# Patient Record
Sex: Male | Born: 1945 | Race: White | Hispanic: No | Marital: Married | Smoking: Former smoker
Health system: Southern US, Community
[De-identification: ages and names within clinical notes are randomized; demographics above are authoritative.]

## PROBLEM LIST (undated history)

## (undated) DIAGNOSIS — E119 Type 2 diabetes mellitus without complications: Secondary | ICD-10-CM

## (undated) DIAGNOSIS — I10 Essential (primary) hypertension: Secondary | ICD-10-CM

## (undated) DIAGNOSIS — F0280 Dementia in other diseases classified elsewhere without behavioral disturbance: Secondary | ICD-10-CM

---

## 2012-10-26 ENCOUNTER — Inpatient Hospital Stay: Payer: Self-pay | Admitting: Orthopedic Surgery

## 2012-10-26 ENCOUNTER — Ambulatory Visit: Payer: Self-pay | Admitting: Internal Medicine

## 2012-10-26 LAB — BASIC METABOLIC PANEL
Anion Gap: 8 (ref 7–16)
Calcium, Total: 8.9 mg/dL (ref 8.5–10.1)
Chloride: 106 mmol/L (ref 98–107)
Co2: 24 mmol/L (ref 21–32)
Creatinine: 1.14 mg/dL (ref 0.60–1.30)
EGFR (African American): >60
EGFR (Non-African Amer.): >60
Potassium: 3.7 mmol/L (ref 3.5–5.1)
Sodium: 138 mmol/L (ref 136–145)

## 2012-10-26 LAB — CBC
HCT: 40.7 % (ref 40.0–52.0)
HGB: 14.1 g/dL (ref 13.0–18.0)
MCHC: 34.6 g/dL (ref 32.0–36.0)
Platelet: 252 10*3/uL (ref 150–440)
RBC: 4.47 10*6/uL (ref 4.40–5.90)
WBC: 17.2 10*3/uL — ABNORMAL HIGH (ref 3.8–10.6)

## 2012-10-27 LAB — CBC WITH DIFFERENTIAL/PLATELET
Basophil %: 0.7 %
Eosinophil %: 10.5 %
HGB: 13.1 g/dL (ref 13.0–18.0)
Lymphocyte #: 2 10*3/uL (ref 1.0–3.6)
MCH: 31.7 pg (ref 26.0–34.0)
MCHC: 34.5 g/dL (ref 32.0–36.0)
MCV: 92 fL (ref 80–100)
Monocyte #: 1 x10 3/mm (ref 0.2–1.0)
Monocyte %: 9.1 %
Neutrophil #: 6.8 10*3/uL — ABNORMAL HIGH (ref 1.4–6.5)
Neutrophil %: 61.3 %
Platelet: 230 10*3/uL (ref 150–440)
RBC: 4.13 10*6/uL — ABNORMAL LOW (ref 4.40–5.90)
WBC: 11 10*3/uL — ABNORMAL HIGH (ref 3.8–10.6)

## 2012-10-27 LAB — BASIC METABOLIC PANEL
BUN: 16 mg/dL (ref 7–18)
Calcium, Total: 8.4 mg/dL — ABNORMAL LOW (ref 8.5–10.1)
Co2: 26 mmol/L (ref 21–32)
Creatinine: 1.22 mg/dL (ref 0.60–1.30)
EGFR (African American): >60
EGFR (Non-African Amer.): >60
Osmolality: 280 (ref 275–301)
Sodium: 138 mmol/L (ref 136–145)

## 2012-10-27 LAB — URINALYSIS, COMPLETE
Bacteria: NONE SEEN
Ketone: NEGATIVE
Leukocyte Esterase: NEGATIVE

## 2012-10-28 LAB — CBC WITH DIFFERENTIAL/PLATELET
Basophil #: 0.1 10*3/uL (ref 0.0–0.1)
Basophil %: 0.5 %
Eosinophil %: 4.1 %
HCT: 36 % — ABNORMAL LOW (ref 40.0–52.0)
HGB: 12.1 g/dL — ABNORMAL LOW (ref 13.0–18.0)
Lymphocyte #: 2.5 10*3/uL (ref 1.0–3.6)
Lymphocyte %: 18.4 %
MCH: 30.9 pg (ref 26.0–34.0)
MCHC: 33.6 g/dL (ref 32.0–36.0)
Monocyte %: 8.3 %
Neutrophil #: 9.2 10*3/uL — ABNORMAL HIGH (ref 1.4–6.5)
Platelet: 200 10*3/uL (ref 150–440)
RBC: 3.92 10*6/uL — ABNORMAL LOW (ref 4.40–5.90)

## 2012-10-28 LAB — BASIC METABOLIC PANEL
Anion Gap: 3 — ABNORMAL LOW (ref 7–16)
BUN: 18 mg/dL (ref 7–18)
Chloride: 102 mmol/L (ref 98–107)
Co2: 30 mmol/L (ref 21–32)
Glucose: 178 mg/dL — ABNORMAL HIGH (ref 65–99)
Osmolality: 276 (ref 275–301)

## 2012-10-29 LAB — WBC: WBC: 10.8 10*3/uL — ABNORMAL HIGH (ref 3.8–10.6)

## 2012-10-29 LAB — BASIC METABOLIC PANEL
Anion Gap: 3 — ABNORMAL LOW (ref 7–16)
Calcium, Total: 8.4 mg/dL — ABNORMAL LOW (ref 8.5–10.1)
Chloride: 108 mmol/L — ABNORMAL HIGH (ref 98–107)
Creatinine: 1.12 mg/dL (ref 0.60–1.30)
EGFR (African American): >60
Glucose: 186 mg/dL — ABNORMAL HIGH (ref 65–99)
Potassium: 3.8 mmol/L (ref 3.5–5.1)

## 2012-12-18 ENCOUNTER — Encounter: Payer: Self-pay | Admitting: Orthopedic Surgery

## 2012-12-31 ENCOUNTER — Encounter: Payer: Self-pay | Admitting: Orthopedic Surgery

## 2013-01-10 ENCOUNTER — Other Ambulatory Visit: Payer: Self-pay | Admitting: Orthopedic Surgery

## 2013-01-17 ENCOUNTER — Encounter: Payer: Self-pay | Admitting: Nurse Practitioner

## 2013-01-17 ENCOUNTER — Encounter: Payer: Self-pay | Admitting: Cardiothoracic Surgery

## 2013-01-17 ENCOUNTER — Ambulatory Visit: Payer: Self-pay

## 2013-01-30 ENCOUNTER — Encounter: Payer: Self-pay | Admitting: Orthopedic Surgery

## 2013-02-21 ENCOUNTER — Encounter: Payer: Self-pay | Admitting: Nurse Practitioner

## 2013-02-21 ENCOUNTER — Encounter: Payer: Self-pay | Admitting: Surgery

## 2013-03-02 ENCOUNTER — Encounter: Payer: Self-pay | Admitting: Surgery

## 2013-08-01 ENCOUNTER — Ambulatory Visit: Payer: Self-pay | Admitting: Internal Medicine

## 2014-11-22 NOTE — H&P (Signed)
Subjective/Chief Complaint Left ankle injury   History of Present Illness Patient is a 69 y/o male who sustained an injury to the left ankle while cutting down tree branches on his property today.  A branch swung down and hit him in the lateral leg.  His foot was planted and he sustained a valgus stress to the ankle.   The branch landed on the patient and he had to cut the branch off his leg.  He had pain and deformity after the fall.  He initially went to [facility] urgent care and was transferred to [facility] ED.  Radiographs demostrated a fracture of the lateral malleolus with significant lateral talar subluxation.   Past Med/Surgical Hx:  Diabetes Mellitus, Type II (NIDD):   ALLERGIES:  No Known Allergies:   HOME MEDICATIONS: Medication Instructions Status  insulin isophane-insulin regular  subcutaneous  Active  sertraline  orally  Active  glimepiride 1  orally once a day Active  metformin 1  orally 2 times a day Active   Family and Social History:  Family History Non-Contributory   Social History negative tobacco   Place of Living Home   Review of Systems:  Subjective/Chief Complaint Left ankle pain and swelling.   Physical Exam:  GEN no acute distress   HEENT PERRL, hearing intact to voice, moist oral mucosa, Oropharynx clear   NECK supple  No masses  trachea midline   RESP normal resp effort  clear BS  no use of accessory muscles   CARD regular rate  no murmur   ABD denies tenderness  soft  normal BS   LYMPH negative neck   EXTR Left ankle: skin intact.  +ecchymosis, pedal pulses are palpable, foot is warm and well perfused.  Patient had swelling and valgus deformity prior to reduction.   SKIN normal to palpation, No ulcers   NEURO motor/sensory function intact   PSYCH A+O to time, place, person   Lab Results: Routine Chem:  27-Mar-14 18:06   Glucose, Serum  119  BUN 16  Creatinine (comp) 1.14  Sodium, Serum 138  Potassium, Serum 3.7  Chloride, Serum 106   CO2, Serum 24  Calcium (Total), Serum 8.9  Anion Gap 8  Osmolality (calc) 278  eGFR (African American) >60  eGFR (Non-African American) >60 (eGFR values <59m/min/1.73 m2 may be an indication of chronic kidney disease (CKD). Calculated eGFR is useful in patients with stable renal function. The eGFR calculation will not be reliable in acutely ill patients when serum creatinine is changing rapidly. It is not useful in  patients on dialysis. The eGFR calculation may not be applicable to patients at the low and high extremes of body sizes, pregnant women, and vegetarians.)  Routine Hem:  27-Mar-14 18:06   WBC (CBC)  17.2  RBC (CBC) 4.47  Hemoglobin (CBC) 14.1  Hematocrit (CBC) 40.7  Platelet Count (CBC) 252 (Result(s) reported on 26 Oct 2012 at 06:33PM.)  MCV 91  MCH 31.5  MCHC 34.6  RDW 13.2   Radiology Results: XRay:    27-Mar-14 15:57, Ankle Left Complete ([place])  Ankle Left Complete ([place])  REASON FOR EXAM:    pain/swelling  COMMENTS:       PROCEDURE: MDR - MDR ANKLE LEFT COMPLETE  - Oct 26 2012  3:57PM     RESULT: There is fracture involving the distal left fibular shaft   superior to the distal tibiofibular articulation with lateral   displacement of the talus relative to the tibia. Avulsion is seen at the  medial malleolus. Posterior malleolus appears to be intact on the views   submitted although true lateral view was not obtained.    IMPRESSION:  Fracture-dislocation of the left ankle as described.   Orthopedic surgical consultation and followup is recommended.    Dictation Site: 2    Verified By: Sundra Aland, M.D., MD    27-Mar-14 16:53, Tibia and Fibula Left Lower Leg ([place])  Tibia and Fibula Left Lower Leg ([place])  REASON FOR EXAM:    possible trimalleolar frx, mid shin pain also  COMMENTS:       PROCEDURE: MDR - MDR TIBIA AND FIBULA LT-LOW LEG  - Oct 26 2012  4:53PM     RESULT: Angulated fractures of the distal left fibula. Tibial talar  joint   is disrupted.    IMPRESSION:  Angulated fracture with comminution of the distal fibular   shaft. Disruption of the tibiotalar joints present.        Verified By: Osa Craver, M.D., MD  LabUnknown:    27-Mar-14 15:57, Ankle Left Complete ([place])  PACS Image    27-Mar-14 16:53, Tibia and Fibula Left Lower Leg ([place])  PACS Image    Assessment/Admission Diagnosis Left ankle lateral malleolus fracture with talar subluxation   Plan Patient informed of his injury.  I have recommended a closed reduction with an ankle block in the ER and open reduction and internal fixation of the lateral malleolus tomorrow.  The risks and benefits of surgical intervention were discussed in detail with the patient. The patient expressed understanding of the risks and benefits and agreed with plans for surgery. The risks include, but are not limited to: infection, bleeding, nerve and blood vessel injury (especially the superficial peroneal nerve leading to dorsal foot numbness), ankle stiffness, osteoarthritis, painful hardware, hardware failure, persistent pain or instability and the need for further surgery.  Medical risks include, but are not limited to:  DVT, and PE, MI, stroke, pneumonia, respiratory failure and death.  Patient is NPO after midnight.  A medical consult has been ordered for pre-op clearance.    An ankle block was performed by injecting the ankle joint from an anterior approach under sterile conditions with a 50:50 mix of 1% lidocaine plain and 0.25% marcaine plain.  A closed reduction was then performed with a varus force applied to the left ankle.  An AO splint was then applied.  Post-reduction films were then ordered and reviewed.  I have reviewed all the patient's labs and rediographic studies.  Surgery will be tomorrow morning pending medical clearance.   Electronic Signatures: Thornton Park (MD)  (Signed 27-Mar-14 22:13)  Authored: CHIEF COMPLAINT and HISTORY, PAST  MEDICAL/SURGIAL HISTORY, ALLERGIES, HOME MEDICATIONS, FAMILY AND SOCIAL HISTORY, REVIEW OF SYSTEMS, PHYSICAL EXAM, LABS, Radiology, ASSESSMENT AND PLAN   Last Updated: 27-Mar-14 22:13 by Thornton Park (MD)

## 2014-11-22 NOTE — Op Note (Signed)
PATIENT NAME:  Arthur [name], Arthur [name] MR#:  [id number] DATE OF BIRTH:  06-13-1946  DATE OF PROCEDURE:  10/27/2012  PREOPERATIVE DIAGNOSIS: Left lateral malleolar ankle fracture with deltoid ligament tear.  POSTOPERATIVE DIAGNOSIS:   Left lateral malleolar ankle fracture with deltoid ligament tear.  PROCEDURE: Open reduction internal fixation of left lateral malleolus fracture and repair of the deltoid ligament.   SURGEON: Juanell FairlyKevin Dara Camargo, M.D.   ANESTHESIA: General.   COMPLICATIONS: None.   ESTIMATED BLOOD LOSS: Minimal.   TOURNIQUET TIME:  108 minutes.   IMPLANTS:  Synthes 1/3 tubular plate and Mitek Superanchor for deltoid ligament repair.  INDICATIONS FOR PROCEDURE: The patient is a 69 year old male who injured his left ankle while cutting trees with a chain saw yesterday. A portion of the tree hit his left leg causing the above-noted injury. The patient had an attempted closed reduction under ankle block in the Emergency Room last night which did not adequately reduce the fracture. Given the patient's high functioning at baseline, I recommended open reduction and internal fixation of the left lateral malleolus fracture and possible repair of the deltoid ligament and syndesmosis if necessary.   I reviewed the risks and benefits of surgery with the patient prior to surgery. He understands the risks include of surgery include infection, bleeding, nerve or blood vessel injury especially injury to the superficial peroneal nerve which may lead to permanent dorsal foot numbness, malunion, nonunion, hardware failure, painful hardware, persistent ankle pain or osteoarthritis and the need for further surgery. Medical complications include, but are not limited to, deep vein thrombosis and pulmonary embolism, myocardial infarction, stroke, pneumonia, respiratory failure and death. The patient understood these risks and wished to proceed.   PROCEDURE NOTE: The patient was brought to the operating room on  10/27/2012 after being cleared by the hospitalist service for surgery. He underwent general endotracheal intubation and was laid supine on a regular surgical table. The patient was prepped and draped in a sterile fashion. A timeout was performed to verify the patient's name, date of birth, medical record number, correct site of surgery and correct procedure to be performed. It is also to ensure the patient had received antibiotics and that all appropriate instruments, implants and radiographic studies were available in the room. Once all in attendance were in agreement, the case began.   The patient's leg was exsanguinated with an Esmarch. The tourniquet was inflated to 275 mmHg for a total 108 minutes. Once the patient was exsanguinated a vertical incision was made over the medial malleolus. It was felt that the failure to close reduce the ankle was due to inter position of the deltoid ligament within the medial mortise. The subcutaneous tissues were carefully dissected using a Metzenbaum scissor and pick-up. The deltoid ligament was indeed in the joint and was removed from the mortise joints.  The deep deltoid fibers appeared to have avulsed off from the talus. The attention was then turned to the lateral side of the ankle.   A longitudinal incision based over the lateral malleolus was made. The subcutaneous tissues were carefully dissected using a Metzenbaum scissor and pick-up. Care was taken to look for and avoid injury to the superficial peroneal nerve. The fracture site was identified. The overlying soft tissue was removed from the lateral aspect of the fibula with an elevator. The fracture hematoma was curetted out and the wound was copiously irrigated. A fracture clamp was used to reduce the oblique fibular fracture. Then a single lag screw, 3.5 mm bicortical lag screw,  was then placed across the fracture site which helped to hold the fracture reduction. A one-third tubular plate x 8 holes was then  placed in a neutralization fashion. Four holes below the fracture and 3 above were placed.  These included 3 bicortical screws proximal to the fracture and then one bicortical screw just below the fracture along with a fully threaded cancellous screws. FluoroScan imaging in both the AP and lateral planes was performed throughout the case to ensure anatomic reduction of the fracture and good positioning of the hardware. Once the fibular plate was in place, the attention was turned back to the medial ankle.   A single Mitek super anchor was placed into the medial wall of the talus. The deep deltoid ligament was then repaired along with the anterior capsule using this Mitek super anchor. The superficial fibers of the deltoid were then approximated in their anatomic position. There was significant fraying of the superior fibers of the deltoid. The wound was copiously irrigated both medially and laterally. The subcutaneous tissues of both were copiously irrigated. A stress test was performed along with a cotton test to ensure no syndesmotic disruption. The syndesmosis was stable despite stress testing. There was no widening seen on cotton test as well. Both wounds were again were copiously irrigated. The subcutaneous tissues were closed with 2-0 Vicryl and the skin approximated with staples. Dry sterile dressings were applied. 0.25% Marcaine plain was injected both into both incisions for assistance with postoperative pain relief. An AO splint was applied. The patient was then awakened and brought to the PAC-U in stable condition. I was scrubbed and present for the entire case and all sharp and instrument counts were correct at the conclusion of the case. I spoke with the patient's daughter in the patient's hospital room following surgery to let her know that the case had gone without complication and that her father was stable in the recovery room.     ____________________________ Kathreen Devoid,  MD klk:ct D: 10/27/2012 15:57:17 ET T: 10/28/2012 07:51:20 ET JOB#: [id number]  cc: Kathreen Devoid, MD, <Dictator> Kathreen Devoid MD ELECTRONICALLY SIGNED 11/09/2012 10:59

## 2014-11-22 NOTE — Discharge Summary (Signed)
PATIENT NAME:  Arthur [name], Ankith L MR#:  [id number] DATE OF BIRTH:  25-Feb-1946  DATE OF ADMISSION:  10/26/2012 DATE OF DISCHARGE:  10/29/2012  ADMITTING DIAGNOSIS: Left ankle lateral malleolus fracture with talar subluxation.   HISTORY OF PRESENT ILLNESS: The patient is a 69 year old male who was cutting down tree branches on his property the day of admission. A branch swung down and hit him in the lateral aspect of the left leg. His leg sustained a valgus stress injury, and he sustained the above-noted injury as a result. He went to [facility] where x-rays were taken. He was diagnosed with a lateral malleolus fracture with lateral talar subluxation.   PAST MEDICAL/SURGICAL HISTORY:  Type 2 diabetes.   ALLERGIES: None.   MEDICATIONS: Include insulin, sertraline, glimepiride and metformin.   SOCIAL HISTORY: The patient does not smoke and lives at home.   HOSPITAL COURSE: The patient was admitted on 10/26/2012. He was cleared by the medical service for surgery. He went to the operating room on 10/27/2012 for an uncomplicated open reduction internal fixation of the lateral malleolus and repair of the deltoid ligament. Postoperatively, the patient was admitted to the orthopedic floor. On postoperative day 1, the patient was alert, had mild left ankle pain. He started physical therapy and completed 24 hours of postop antibiotics. By postoperative day #2, the patient was alert and sitting up. He was ready to go home. He had clinically improved and had done well with physical therapy. The patient was noted to have swelling in the left knee with an x-ray was showing underlying osteoarthritis. He was discharged with instructions to follow up in the orthopedic office in 2 to 3 days for re-evaluation of his ankle and left knee.   DISCHARGE INSTRUCTIONS: The patient was discharged with instructions to remain nonweightbearing on the left lower extremity. He will elevate his leg  whenever possible and use a walker for assistance with ambulation.   He will take enteric-coated aspirin for DVT prophylaxis. The patient was sent home with narcotic analgesia.   ____________________________ Kathreen DevoidKevin L. Faylynn Stamos, MD klk:cb D: 11/13/2012 16:12:33 ET T: 11/13/2012 16:28:29 ET JOB#: [id number]  cc: Kathreen DevoidKevin L. Kaylamarie Swickard, MD, <Dictator> Kathreen DevoidKEVIN L Sanyah Molnar MD ELECTRONICALLY SIGNED 11/14/2012 23:40

## 2014-11-22 NOTE — Consult Note (Signed)
PATIENT NAME:  Arthur [name], Arthur [name] MR#:  [id number] DATE OF BIRTH:  1946-01-18  DATE OF CONSULTATION:  10/27/2012  REFERRING PHYSICIAN: Kathreen DevoidKevin [name]. Krasinski, MD  CONSULTING PHYSICIAN:  Starleen Armsawood S. Gwendolyne Welford, MD  PRIMARY CARE PHYSICIAN: Lysle Rubensaniel D. Crummett, MD  REASON FOR CONSULTATION: Preoperative evaluation.   HISTORY OF PRESENT ILLNESS: This is a 69 year old male with significant past medical history of diabetes, depression and anxiety, who presents today with mechanical fall and left ankle fracture. The patient was working in his yard, where he was cutting down tree branches, where a branch swung down and hit him in the lateral leg, where his foot was planted, and he sustained a valgus stress to the ankle. The patient was admitted under orthopedic service, who requested medical consult for preop evaluation and medical management. The patient is planned to go for surgery today. The patient denies any chest pain, any shortness of breath, any nausea, vomiting, coffee-ground emesis, fever or chills. The patient had leukocytosis at 17,000, but his urinalysis was negative. Denies any cough or productive sputum. As well, the patient had normal EKG.   PAST MEDICAL HISTORY:  1. Diabetes.  2. Depression and anxiety.   PAST SURGICAL HISTORY: None.   HOME MEDICATIONS:  1. Percocet as needed.  2. Lantus 15 units at bedtime.  3. Metformin 850 twice a day. 4. Glimepiride 2 mg daily.  5. Sertraline 50 mg daily.  6. Aspirin 81 mg daily.  7. Tylenol as needed.   SOCIAL HISTORY: The patient is married. No history of alcohol or illicit drug use. Used to be a previous smoker, none currently.  FAMILY HISTORY: Significant for coronary artery disease in the family.    REVIEW OF SYSTEMS:  CONSTITUTIONAL: The patient denies fever, chills, fatigue, weakness, weight loss, weight gain.  HEENT: Denies any blurry vision, double vision, eye pain, glaucoma or inflammation. Denies any snoring, epistaxis, nasal drip or  discharge or hearing difficulties.  RESPIRATORY: Denies cough, asthma, sleep apnea or COPD.  CARDIOVASCULAR: Denies chest pain, edema or high blood pressure.  GASTROINTESTINAL: Denies nausea, vomiting, diarrhea, abdominal pain.  MUSCULOSKELETAL: Denies any muscle cramps, back pain, neck pain. Has left ankle pain with the fracture today.  NEUROLOGICAL: Denies headache, numbness, focal deficits or weakness or CVA or migraine.  ENDOCRINE: Denies polyuria, polydipsia, heat or cold intolerance. Has history of diabetes. HEMATOLOGIC: Denies any easy bruising or bleeding diathesis or history of blood clots.  SKIN: Denies easy bruising or skin lesions.  PSYCHIATRIC: Denies any insomnia, substance abuse, alcohol abuse. Has history of depression and anxiety.   PHYSICAL EXAMINATION:  VITAL SIGNS: Temperature 98.8, pulse 77, respiratory rate 18, blood pressure 131/76, saturating 98% on room air.  GENERAL: Well-nourished male, looks comfortable in bed, in no apparent distress.  HEENT: Head atraumatic, normocephalic. Pupils equal and reactive to light. Pink conjunctivae. Anicteric sclerae. Moist oral mucosa.  NECK: Supple. No thyromegaly. No JVD.  CHEST: Good air entry bilaterally. No wheezing, rales or rhonchi.  CARDIOVASCULAR: S1, S2 heard. No rubs, murmurs or gallops.  ABDOMEN: Soft, nontender, nondistended. Bowel sounds present.  EXTREMITIES: Right lower extremity dorsalis pedis pulses felt. No clubbing. No cyanosis. No edema. The left lower extremity is currently wrapped.  NEUROLOGICAL: Cranial nerves grossly intact. Motor 5 out of 5. No focal deficits.  SKIN: Normal skin turgor. Warm and dry. No rash.  PSYCHIATRIC: Pleasant, appropriate affect. Awake and alert x3. Intact judgment and insight.   PERTINENT LABORATORIES: Fingerstick is 194, glucose 119, BUN 16, creatinine 1.14, sodium 138,  potassium 3.7, chloride 106, CO2 24. White blood cells 17.2, hemoglobin 14.1, hematocrit 40.7, platelets 252. INR 1.  Urinalysis negative for leukocyte esterase and nitrites and less than 1 white blood cell.   ELECTROCARDIOGRAM: Showing normal sinus rhythm without significant ST or T wave changes.   ASSESSMENT AND PLAN:  1. This is a 69 year old male with significant past medical history of diabetes, who presents with mechanical fall and left ankle fracture. Plan for open ankle reduction repair. No further workup is needed prior to proceeding with surgery. May proceed with surgery. The patient is low risk for surgery.  2. Diabetes mellitus. Will hold oral hypoglycemic agents. Will keep the patient on insulin sliding scale and will lower his Lantus dose to 6 units daily, and he may get his Lantus prior to surgery today to prevent hyperglycemia later during the day.  3. Depression. Will continue on sertraline.  4. Leukocytosis. This is most likely related to stress from the fracture. Had negative urinalysis. The patient is afebrile.  5. Deep vein thrombosis prophylaxis as per primary orthopedic team.   TOTAL TIME SPENT ON MEDICAL CONSULT: 45 minutes.     ____________________________ Starleen Arms, MD dse:OSi D: 10/27/2012 06:48:48 ET T: 10/27/2012 07:44:38 ET JOB#: [id number]  cc: Starleen Arms, MD, <Dictator> [place] Teena Irani MD ELECTRONICALLY SIGNED 10/30/2012 5:33

## 2018-01-05 ENCOUNTER — Emergency Department: Payer: Medicare Other

## 2018-01-05 ENCOUNTER — Encounter: Payer: Self-pay | Admitting: Emergency Medicine

## 2018-01-05 ENCOUNTER — Other Ambulatory Visit: Payer: Self-pay

## 2018-01-05 ENCOUNTER — Emergency Department
Admission: EM | Admit: 2018-01-05 | Discharge: 2018-01-05 | Disposition: A | Discharge status: Home / Self Care | Payer: Medicare Other | Attending: Emergency Medicine | Admitting: Emergency Medicine

## 2018-01-05 DIAGNOSIS — I10 Essential (primary) hypertension: Secondary | ICD-10-CM | POA: Diagnosis not present

## 2018-01-05 DIAGNOSIS — R079 Chest pain, unspecified: Secondary | ICD-10-CM | POA: Diagnosis not present

## 2018-01-05 DIAGNOSIS — Z87891 Personal history of nicotine dependence: Secondary | ICD-10-CM | POA: Insufficient documentation

## 2018-01-05 DIAGNOSIS — E119 Type 2 diabetes mellitus without complications: Secondary | ICD-10-CM | POA: Insufficient documentation

## 2018-01-05 HISTORY — DX: Essential (primary) hypertension: I10

## 2018-01-05 HISTORY — DX: Type 2 diabetes mellitus without complications: E11.9

## 2018-01-05 LAB — BASIC METABOLIC PANEL
Anion gap: 9 (ref 5–15)
BUN: 21 mg/dL — AB (ref 6–20)
CHLORIDE: 107 mmol/L (ref 101–111)
CO2: 22 mmol/L (ref 22–32)
CREATININE: 1.29 mg/dL — AB (ref 0.61–1.24)
Calcium: 9.1 mg/dL (ref 8.9–10.3)
GFR calc Af Amer: >60 mL/min (ref >60)
GFR calc non Af Amer: 53 mL/min — ABNORMAL LOW (ref >60)
Glucose, Bld: 162 mg/dL — ABNORMAL HIGH (ref 65–99)
POTASSIUM: 3.7 mmol/L (ref 3.5–5.1)
Sodium: 138 mmol/L (ref 135–145)

## 2018-01-05 LAB — CBC
HEMATOCRIT: 37.6 % — AB (ref 40.0–52.0)
Hemoglobin: 13.1 g/dL (ref 13.0–18.0)
MCH: 31.8 pg (ref 26.0–34.0)
MCHC: 34.8 g/dL (ref 32.0–36.0)
MCV: 91.4 fL (ref 80.0–100.0)
PLATELETS: 333 10*3/uL (ref 150–440)
RBC: 4.11 MIL/uL — ABNORMAL LOW (ref 4.40–5.90)
RDW: 13.9 % (ref 11.5–14.5)
WBC: 13.2 10*3/uL — ABNORMAL HIGH (ref 3.8–10.6)

## 2018-01-05 LAB — TROPONIN I: Troponin I: <0.03 ng/mL (ref <0.03)

## 2018-01-05 MED ORDER — IOPAMIDOL (ISOVUE-370) INJECTION 76%
75.0000 mL | Freq: Once | INTRAVENOUS | Status: AC | PRN
  Administered 2018-01-05: 75 mL via INTRAVENOUS

## 2018-01-05 NOTE — ED Triage Notes (Signed)
Pt reports that he developed chest pain today. It is left sided with no radiation, N/V, SOB or diaphoresis.

## 2018-01-05 NOTE — ED Provider Notes (Signed)
[facility] Emergency Department Provider Note  Time seen: 6:41 PM  I have reviewed the triage vital signs and the nursing notes.   HISTORY  Chief Complaint Chest Pain    HPI Arthur [name] is a 72 y.o. male with a past medical history of hypertension, diabetes, presents to the emergency department for chest pain and back pain.  According to the patient for the past 2 to 3 weeks he has been complaining of upper back pain.  He is gone to see his chiropractor several times with minimal relief.  Today since this morning he is also been feeling pain in his left chest.  Denies any nausea, shortness of breath or diaphoresis.  States the pain is worse only if he takes a big deep breath in.  Denies any fever cough or congestion.  Currently describes the discomfort is mild but more moderate with deep inspiration.   Past Medical History:  Diagnosis Date  . Diabetes (HCC)   . Hypertension     There are no active problems to display for this patient.    Prior to Admission medications   Not on File    Allergies no known allergies  History reviewed. No pertinent family history.  Social History Social History   Tobacco Use  . Smoking status: Former Smoker  Substance Use Topics  . Alcohol use: Not Currently  . Drug use: Never    Review of Systems Constitutional: Negative for fever. Eyes: Negative for visual complaints ENT: Negative for recent illness/congestion Cardiovascular: Left chest pain, worse with inspiration. Respiratory: Negative for shortness of breath.  Negative for cough. Gastrointestinal: Negative for abdominal pain, vomiting and diarrhea. Genitourinary: Negative for urinary compaints Musculoskeletal: Negative for leg pain or swelling Skin: Negative for skin complaints  Neurological: Negative for headache All other ROS negative  ____________________________________________   PHYSICAL EXAM:  Constitutional: Alert and oriented. Well  appearing and in no distress. Eyes: Normal exam ENT   Head: Normocephalic and atraumatic.   Mouth/Throat: Mucous membranes are moist. Cardiovascular: Normal rate, regular rhythm.  Respiratory: Normal respiratory effort without tachypnea nor retractions. Breath sounds are clear  Gastrointestinal: Soft and nontender. No distention.   Musculoskeletal: Nontender with normal range of motion in all extremities. No lower extremity tenderness or edema. Neurologic:  Normal speech and language. No gross focal neurologic deficits  Skin:  Skin is warm, dry and intact.  Psychiatric: Mood and affect are normal.   ____________________________________________    EKG  EKG reviewed and interpreted by myself shows normal sinus rhythm at 78 bpm with a narrow QRS, normal axis, normal intervals, no ST changes.  ____________________________________________    RADIOLOGY  Chest x-ray negative  ____________________________________________   INITIAL IMPRESSION / ASSESSMENT AND PLAN / ED COURSE  Pertinent labs & imaging results that were available during my care of the patient were reviewed by me and considered in my medical decision making (see chart for details).  She presents emergency department for chest pain.  Differential would include ACS, chest wall pain, pleurisy, muscular skeletal pain, PE, pneumothorax.  Patient's work-up is largely reassuring.  EKG is normal.  Labs including troponin is negative.  Chest x-ray is normal.  However the patient is complaining of pain in his center of the left chest worse with deep inspiration.  Given the pleuritic nature of the pain will obtain CT imaging of the chest to rule out PE.  No history of blood clots or MI.  No leg pain or swelling.  Patient CT scan largely negative.  Troponin is normal.  Given the patient was a daily smoker until 15 years ago I did recommend follow-up with his doctor for repeat CT in 12 months.  Patient agreeable to plan of  care.  ____________________________________________   FINAL CLINICAL IMPRESSION(S) / ED DIAGNOSES  Chest pain    Minna AntisPaduchowski, Dhanya Bogle, MD 01/05/18 1943

## 2018-01-05 NOTE — ED Notes (Signed)

## 2018-01-05 NOTE — ED Notes (Signed)
Patient transported to CT 

## 2018-01-18 ENCOUNTER — Other Ambulatory Visit: Payer: Self-pay | Admitting: Orthopedic Surgery

## 2018-01-18 DIAGNOSIS — S32010A Wedge compression fracture of first lumbar vertebra, initial encounter for closed fracture: Secondary | ICD-10-CM

## 2018-01-19 ENCOUNTER — Ambulatory Visit
Admission: RE | Admit: 2018-01-19 | Discharge: 2018-01-19 | Disposition: A | Discharge status: Home / Self Care | Payer: Medicare Other | Source: Ambulatory Visit | Attending: Orthopedic Surgery | Admitting: Orthopedic Surgery

## 2018-01-19 DIAGNOSIS — M48061 Spinal stenosis, lumbar region without neurogenic claudication: Secondary | ICD-10-CM | POA: Diagnosis not present

## 2018-01-19 DIAGNOSIS — S32010A Wedge compression fracture of first lumbar vertebra, initial encounter for closed fracture: Secondary | ICD-10-CM | POA: Insufficient documentation

## 2018-01-19 DIAGNOSIS — M47816 Spondylosis without myelopathy or radiculopathy, lumbar region: Secondary | ICD-10-CM | POA: Insufficient documentation

## 2018-01-19 DIAGNOSIS — X58XXXA Exposure to other specified factors, initial encounter: Secondary | ICD-10-CM | POA: Insufficient documentation

## 2018-01-23 ENCOUNTER — Ambulatory Visit
Admission: RE | Admit: 2018-01-23 | Discharge: 2018-01-23 | Disposition: A | Discharge status: Home / Self Care | Payer: Medicare Other | Source: Ambulatory Visit | Attending: Orthopedic Surgery | Admitting: Orthopedic Surgery

## 2018-01-23 ENCOUNTER — Encounter: Payer: Self-pay | Admitting: Anesthesiology

## 2018-01-23 ENCOUNTER — Ambulatory Visit: Payer: Medicare Other | Admitting: Anesthesiology

## 2018-01-23 ENCOUNTER — Ambulatory Visit: Payer: Medicare Other

## 2018-01-23 ENCOUNTER — Encounter
Admission: RE | Disposition: A | Discharge status: Home / Self Care | Payer: Self-pay | Source: Ambulatory Visit | Attending: Orthopedic Surgery

## 2018-01-23 DIAGNOSIS — Z794 Long term (current) use of insulin: Secondary | ICD-10-CM | POA: Insufficient documentation

## 2018-01-23 DIAGNOSIS — E119 Type 2 diabetes mellitus without complications: Secondary | ICD-10-CM | POA: Diagnosis not present

## 2018-01-23 DIAGNOSIS — Z9641 Presence of insulin pump (external) (internal): Secondary | ICD-10-CM | POA: Insufficient documentation

## 2018-01-23 DIAGNOSIS — M549 Dorsalgia, unspecified: Secondary | ICD-10-CM | POA: Diagnosis present

## 2018-01-23 DIAGNOSIS — I10 Essential (primary) hypertension: Secondary | ICD-10-CM | POA: Insufficient documentation

## 2018-01-23 DIAGNOSIS — Z79899 Other long term (current) drug therapy: Secondary | ICD-10-CM | POA: Diagnosis not present

## 2018-01-23 DIAGNOSIS — Z7982 Long term (current) use of aspirin: Secondary | ICD-10-CM | POA: Insufficient documentation

## 2018-01-23 DIAGNOSIS — Z419 Encounter for procedure for purposes other than remedying health state, unspecified: Secondary | ICD-10-CM

## 2018-01-23 DIAGNOSIS — X509XXA Other and unspecified overexertion or strenuous movements or postures, initial encounter: Secondary | ICD-10-CM | POA: Diagnosis not present

## 2018-01-23 DIAGNOSIS — Z87891 Personal history of nicotine dependence: Secondary | ICD-10-CM | POA: Diagnosis not present

## 2018-01-23 DIAGNOSIS — S32010A Wedge compression fracture of first lumbar vertebra, initial encounter for closed fracture: Secondary | ICD-10-CM | POA: Insufficient documentation

## 2018-01-23 HISTORY — PX: KYPHOPLASTY: SHX5884

## 2018-01-23 LAB — GLUCOSE, CAPILLARY
Glucose-Capillary: 195 mg/dL — ABNORMAL HIGH (ref 65–99)
Glucose-Capillary: 105 mg/dL — ABNORMAL HIGH (ref 65–99)

## 2018-01-23 SURGERY — KYPHOPLASTY
Anesthesia: Monitor Anesthesia Care

## 2018-01-23 MED ORDER — IOPAMIDOL (ISOVUE-M 200) INJECTION 41%
INTRAMUSCULAR | Status: AC
  Filled 2018-01-23: qty 20

## 2018-01-23 MED ORDER — CEFAZOLIN SODIUM-DEXTROSE 2-4 GM/100ML-% IV SOLN
2.0000 g | Freq: Once | INTRAVENOUS | Status: AC
  Administered 2018-01-23: 2 g via INTRAVENOUS

## 2018-01-23 MED ORDER — FENTANYL CITRATE (PF) 100 MCG/2ML IJ SOLN
INTRAMUSCULAR | Status: AC
  Filled 2018-01-23: qty 2

## 2018-01-23 MED ORDER — CEFAZOLIN SODIUM-DEXTROSE 2-4 GM/100ML-% IV SOLN
INTRAVENOUS | Status: AC
  Filled 2018-01-23: qty 100

## 2018-01-23 MED ORDER — MIDAZOLAM HCL 2 MG/2ML IJ SOLN
INTRAMUSCULAR | Status: DC | PRN
  Administered 2018-01-23: 2 mg via INTRAVENOUS

## 2018-01-23 MED ORDER — FAMOTIDINE 20 MG PO TABS
ORAL_TABLET | ORAL | Status: AC
  Filled 2018-01-23: qty 1

## 2018-01-23 MED ORDER — LIDOCAINE HCL (PF) 1 % IJ SOLN
INTRAMUSCULAR | Status: AC
  Filled 2018-01-23: qty 60

## 2018-01-23 MED ORDER — LIDOCAINE HCL 1 % IJ SOLN
INTRAMUSCULAR | Status: DC | PRN
  Administered 2018-01-23: 15 mL
  Administered 2018-01-23: 10 mL

## 2018-01-23 MED ORDER — PROPOFOL 500 MG/50ML IV EMUL
INTRAVENOUS | Status: DC | PRN
  Administered 2018-01-23: 55 ug/kg/min via INTRAVENOUS

## 2018-01-23 MED ORDER — MIDAZOLAM HCL 2 MG/2ML IJ SOLN
INTRAMUSCULAR | Status: AC
  Filled 2018-01-23: qty 2

## 2018-01-23 MED ORDER — BUPIVACAINE-EPINEPHRINE (PF) 0.5% -1:200000 IJ SOLN
INTRAMUSCULAR | Status: AC
  Filled 2018-01-23: qty 30

## 2018-01-23 MED ORDER — FENTANYL CITRATE (PF) 100 MCG/2ML IJ SOLN
INTRAMUSCULAR | Status: AC
  Administered 2018-01-23: 25 ug via INTRAVENOUS
  Filled 2018-01-23: qty 2

## 2018-01-23 MED ORDER — HYDROCODONE-ACETAMINOPHEN 5-325 MG PO TABS: 1.0000 | ORAL_TABLET | Freq: Four times a day (QID) | ORAL | 0 refills | Status: DC | PRN

## 2018-01-23 MED ORDER — FENTANYL CITRATE (PF) 100 MCG/2ML IJ SOLN
25.0000 ug | INTRAMUSCULAR | Status: AC | PRN
  Administered 2018-01-23 (×6): 25 ug via INTRAVENOUS

## 2018-01-23 MED ORDER — ONDANSETRON HCL 4 MG/2ML IJ SOLN: 4.0000 mg | Freq: Once | INTRAMUSCULAR | Status: DC | PRN

## 2018-01-23 MED ORDER — SODIUM CHLORIDE 0.9 % IV SOLN
INTRAVENOUS | Status: DC
  Administered 2018-01-23: 12:00:00 via INTRAVENOUS

## 2018-01-23 MED ORDER — FAMOTIDINE 20 MG PO TABS
20.0000 mg | ORAL_TABLET | Freq: Once | ORAL | Status: AC
  Administered 2018-01-23: 20 mg via ORAL

## 2018-01-23 MED ORDER — FENTANYL CITRATE (PF) 100 MCG/2ML IJ SOLN
INTRAMUSCULAR | Status: DC | PRN
  Administered 2018-01-23: 50 ug via INTRAVENOUS

## 2018-01-23 MED ORDER — BUPIVACAINE-EPINEPHRINE (PF) 0.5% -1:200000 IJ SOLN
INTRAMUSCULAR | Status: DC | PRN
  Administered 2018-01-23: 15 mL

## 2018-01-23 MED ORDER — PROPOFOL 10 MG/ML IV BOLUS
INTRAVENOUS | Status: AC
  Filled 2018-01-23: qty 20

## 2018-01-23 SURGICAL SUPPLY — 16 items
CEMENT KYPHON CX01A KIT/MIXER (Cement) ×3 IMPLANT
DERMABOND ADVANCED (GAUZE/BANDAGES/DRESSINGS) ×2
DERMABOND ADVANCED .7 DNX12 (GAUZE/BANDAGES/DRESSINGS) ×1 IMPLANT
DEVICE BIOPSY BONE KYPHX (INSTRUMENTS) ×3 IMPLANT
DRAPE C-ARM XRAY 36X54 (DRAPES) ×3 IMPLANT
DURAPREP 26ML APPLICATOR (WOUND CARE) ×3 IMPLANT
GLOVE SURG SYN 9.0  PF PI (GLOVE) ×2
GLOVE SURG SYN 9.0 PF PI (GLOVE) ×1 IMPLANT
GOWN SRG 2XL LVL 4 RGLN SLV (GOWNS) ×1 IMPLANT
GOWN STRL NON-REIN 2XL LVL4 (GOWNS) ×2
GOWN STRL REUS W/ TWL LRG LVL3 (GOWN DISPOSABLE) ×1 IMPLANT
GOWN STRL REUS W/TWL LRG LVL3 (GOWN DISPOSABLE) ×2
PACK KYPHOPLASTY (MISCELLANEOUS) ×3 IMPLANT
STRAP SAFETY 5IN WIDE (MISCELLANEOUS) ×3 IMPLANT
TRAY KYPHOPAK 15/3 EXPRESS 1ST (MISCELLANEOUS) ×1 IMPLANT
TRAY KYPHOPAK 20/3 EXPRESS 1ST (MISCELLANEOUS) ×3 IMPLANT

## 2018-01-23 NOTE — Transfer of Care (Signed)
Immediate Anesthesia Transfer of Care Note  Patient: Arthur [name]  Procedure(s) Performed: Anne Ng (N/A )  Patient Location: PACU  Anesthesia Type:MAC  Level of Consciousness: awake and responds to stimulation  Airway & Oxygen Therapy: Patient Spontanous Breathing and Patient connected to nasal cannula oxygen  Post-op Assessment: Report given to RN and Post -op Vital signs reviewed and stable  Post vital signs: Reviewed and stable  Last Vitals:  Vitals Value Taken Time  BP 124/69 01/23/2018  5:09 PM  Temp    Pulse 72 01/23/2018  5:09 PM  Resp 18 01/23/2018  5:09 PM  SpO2 100 % 01/23/2018  5:09 PM  Vitals shown include unvalidated device data.  Last Pain:  Vitals:   01/23/18 1253  TempSrc: Tympanic  PainSc: 0-No pain         Complications: No apparent anesthesia complications

## 2018-01-23 NOTE — Anesthesia Postprocedure Evaluation (Signed)
Anesthesia Post Note  Patient: Arthur [name]  Procedure(s) Performed: Anne Ng (N/A )  Patient location during evaluation: PACU Anesthesia Type: MAC Level of consciousness: awake and alert Pain management: pain level controlled Vital Signs Assessment: post-procedure vital signs reviewed and stable Respiratory status: spontaneous breathing, nonlabored ventilation, respiratory function stable and patient connected to nasal cannula oxygen Cardiovascular status: stable and blood pressure returned to baseline Postop Assessment: no apparent nausea or vomiting Anesthetic complications: no     Last Vitals:  Vitals:   01/23/18 1835 01/23/18 1840  BP:  111/73  Pulse: 68 65  Resp:    Temp: (!) 36.2 C   SpO2: 98% 98%    Last Pain:  Vitals:   01/23/18 1840  TempSrc:   PainSc: 2                  [name] S

## 2018-01-23 NOTE — Discharge Instructions (Addendum)
AMBULATORY SURGERY  DISCHARGE INSTRUCTIONS   1) The drugs that you were given will stay in your system until tomorrow so for the next 24 hours you should not:  A) Drive an automobile B) Make any legal decisions C) Drink any alcoholic beverage   2) You may resume regular meals tomorrow.  Today it is better to start with liquids and gradually work up to solid foods.  You may eat anything you prefer, but it is better to start with liquids, then soup and crackers, and gradually work up to solid foods.   3) Please notify your doctor immediately if you have any unusual bleeding, trouble breathing, redness and pain at the surgery site, drainage, fever, or pain not relieved by medication.    4) Additional Instructions:        Please contact your physician with any problems or Same Day Surgery at [phone], Monday through Friday 6 am to 4 pm, or [place] at [facility] Main number at [phone].Take it easy today and tomorrow.  Remove Band-Aid on Wednesday and then okay to shower.  Resume activities as tolerated started Wednesday.  Call for problems

## 2018-01-23 NOTE — Anesthesia Preprocedure Evaluation (Signed)
Anesthesia Evaluation  Patient identified by MRN, date of birth, ID band Patient awake    Reviewed: Allergy & Precautions, NPO status , Patient's Chart, lab work & pertinent test results, reviewed documented beta blocker date and time   Airway Mallampati: II  TM Distance: >3 FB     Dental  (+) Chipped   Pulmonary former smoker,           Cardiovascular hypertension, Pt. on medications      Neuro/Psych    GI/Hepatic   Endo/Other  diabetes, Type 2  Renal/GU      Musculoskeletal   Abdominal   Peds  Hematology   Anesthesia Other Findings   Reproductive/Obstetrics                             Anesthesia Physical Anesthesia Plan  ASA: III  Anesthesia Plan: MAC   Post-op Pain Management:    Induction:   PONV Risk Score and Plan:   Airway Management Planned:   Additional Equipment:   Intra-op Plan:   Post-operative Plan:   Informed Consent: I have reviewed the patients History and Physical, chart, labs and discussed the procedure including the risks, benefits and alternatives for the proposed anesthesia with the patient or authorized representative who has indicated his/her understanding and acceptance.     Plan Discussed with: CRNA  Anesthesia Plan Comments:         Anesthesia Quick Evaluation

## 2018-01-23 NOTE — Anesthesia Post-op Follow-up Note (Signed)
Anesthesia QCDR form completed.        

## 2018-01-23 NOTE — Anesthesia Procedure Notes (Signed)
Procedure Name: MAC Performed by: Draken Farrior, CRNA Pre-anesthesia Checklist: Patient identified, Emergency Drugs available, Suction available, Patient being monitored and Timeout performed Oxygen Delivery Method: Nasal cannula       

## 2018-01-23 NOTE — H&P (Signed)
Reviewed paper H+P, will be scanned into chart. No changes noted.  

## 2018-01-23 NOTE — Op Note (Signed)
01/23/2018  5:03 PM  PATIENT:  Arthur [name]  72 y.o. male   PRE-OPERATIVE DIAGNOSIS:  CLOSED COMPRESSION FRACTURE OF L1  POST-OPERATIVE DIAGNOSIS:  CLOSED COMPRESSION FRACTURE OF L1  PROCEDURE:  Procedure(s): KYPHOPLASTY-L1 (N/A)  SURGEON: Laurene Footman, MD  ASSISTANTS: none  ANESTHESIA:   local and MAC  EBL:  No intake/output data recorded.  BLOOD ADMINISTERED:none  DRAINS: none   LOCAL MEDICATIONS USED:  MARCAINE    and XYLOCAINE   SPECIMEN:  Source of Specimen:  L1  DISPOSITION OF SPECIMEN:  PATHOLOGY  COUNTS:  YES  TOURNIQUET:  * No tourniquets in log *  IMPLANTS: bone cement  DICTATION: .Dragon Dictation patient was brought to the operating room and after adequate sedation was given the patient was placed prone.  C arm was brought in in good visualization and AP and lateral projections obtained.  After patient identification timeout procedures were completed local anesthetic was infiltrated on the right side at L1.  Back was prepped and draped you sterile fashion and after repeat timeout procedure a spinal needle was placed down to the ankle on the right at L1.  50-50 mix of 1% Xylocaine 8% Sensorcaine with epinephrine was infiltrated along the path of the trocar.  After allowing this to set a small incision was made and a trocar advanced into the pedicle transpedicular fashion to the body antibody biopsy was obtained of L4.  Drilling was carried out followed by inflation of the balloon to 4 cc and then infiltration of a proximally 6 cc into the vertebral body getting very good fill.  There is no extravasation.  After the cemented set the trochars removed and permanent serum views obtained.  The wound was closed with Dermabond followed by Band-Aid.  PLAN OF CARE: Discharge to home after PACU  PATIENT DISPOSITION:  PACU - hemodynamically stable.

## 2018-01-24 ENCOUNTER — Encounter: Payer: Self-pay | Admitting: Orthopedic Surgery

## 2018-01-25 LAB — SURGICAL PATHOLOGY

## 2018-02-08 ENCOUNTER — Emergency Department: Payer: Medicare Other

## 2018-02-08 ENCOUNTER — Encounter: Payer: Self-pay | Admitting: Emergency Medicine

## 2018-02-08 ENCOUNTER — Emergency Department
Admission: EM | Admit: 2018-02-08 | Discharge: 2018-02-08 | Disposition: A | Discharge status: Home / Self Care | Payer: Medicare Other | Attending: Emergency Medicine | Admitting: Emergency Medicine

## 2018-02-08 DIAGNOSIS — M86172 Other acute osteomyelitis, left ankle and foot: Secondary | ICD-10-CM | POA: Insufficient documentation

## 2018-02-08 DIAGNOSIS — Z794 Long term (current) use of insulin: Secondary | ICD-10-CM | POA: Diagnosis not present

## 2018-02-08 DIAGNOSIS — Z7982 Long term (current) use of aspirin: Secondary | ICD-10-CM | POA: Insufficient documentation

## 2018-02-08 DIAGNOSIS — Z87891 Personal history of nicotine dependence: Secondary | ICD-10-CM | POA: Insufficient documentation

## 2018-02-08 DIAGNOSIS — I10 Essential (primary) hypertension: Secondary | ICD-10-CM | POA: Diagnosis not present

## 2018-02-08 DIAGNOSIS — L089 Local infection of the skin and subcutaneous tissue, unspecified: Secondary | ICD-10-CM | POA: Insufficient documentation

## 2018-02-08 DIAGNOSIS — E11628 Type 2 diabetes mellitus with other skin complications: Secondary | ICD-10-CM | POA: Diagnosis not present

## 2018-02-08 DIAGNOSIS — Z79899 Other long term (current) drug therapy: Secondary | ICD-10-CM | POA: Insufficient documentation

## 2018-02-08 LAB — COMPREHENSIVE METABOLIC PANEL
ALBUMIN: 3.9 g/dL (ref 3.5–5.0)
ALT: 12 U/L (ref 0–44)
ANION GAP: 8 (ref 5–15)
AST: 20 U/L (ref 15–41)
Alkaline Phosphatase: 127 U/L — ABNORMAL HIGH (ref 38–126)
BILIRUBIN TOTAL: 0.7 mg/dL (ref 0.3–1.2)
BUN: 23 mg/dL (ref 8–23)
CO2: 26 mmol/L (ref 22–32)
Calcium: 9.1 mg/dL (ref 8.9–10.3)
Chloride: 108 mmol/L (ref 98–111)
Creatinine, Ser: 1.36 mg/dL — ABNORMAL HIGH (ref 0.61–1.24)
GFR calc Af Amer: 58 mL/min — ABNORMAL LOW (ref >60)
GFR calc non Af Amer: 50 mL/min — ABNORMAL LOW (ref >60)
GLUCOSE: 101 mg/dL — AB (ref 70–99)
POTASSIUM: 4.3 mmol/L (ref 3.5–5.1)
SODIUM: 142 mmol/L (ref 135–145)
TOTAL PROTEIN: 7.2 g/dL (ref 6.5–8.1)

## 2018-02-08 LAB — CBC WITH DIFFERENTIAL/PLATELET
BASOS ABS: 0.1 10*3/uL (ref 0–0.1)
Basophils Relative: 1 %
EOS ABS: 1.8 10*3/uL — AB (ref 0–0.7)
EOS PCT: 14 %
HCT: 34.5 % — ABNORMAL LOW (ref 40.0–52.0)
Hemoglobin: 11.8 g/dL — ABNORMAL LOW (ref 13.0–18.0)
Lymphocytes Relative: 23 %
Lymphs Abs: 3 10*3/uL (ref 1.0–3.6)
MCH: 31.8 pg (ref 26.0–34.0)
MCHC: 34.3 g/dL (ref 32.0–36.0)
MCV: 92.8 fL (ref 80.0–100.0)
Monocytes Absolute: 0.8 10*3/uL (ref 0.2–1.0)
Monocytes Relative: 6 %
Neutro Abs: 7.5 10*3/uL — ABNORMAL HIGH (ref 1.4–6.5)
Neutrophils Relative %: 56 %
PLATELETS: 302 10*3/uL (ref 150–440)
RBC: 3.72 MIL/uL — AB (ref 4.40–5.90)
RDW: 13.7 % (ref 11.5–14.5)
WBC: 13.3 10*3/uL — AB (ref 3.8–10.6)

## 2018-02-08 MED ORDER — SULFAMETHOXAZOLE-TRIMETHOPRIM 800-160 MG PO TABS: 2.0000 | ORAL_TABLET | Freq: Two times a day (BID) | ORAL | 0 refills | Status: AC

## 2018-02-08 MED ORDER — AMOXICILLIN-POT CLAVULANATE 875-125 MG PO TABS
1.0000 | ORAL_TABLET | Freq: Once | ORAL | Status: AC
  Administered 2018-02-08: 1 via ORAL

## 2018-02-08 MED ORDER — SULFAMETHOXAZOLE-TRIMETHOPRIM 800-160 MG PO TABS
ORAL_TABLET | ORAL | Status: AC
  Filled 2018-02-08: qty 2

## 2018-02-08 MED ORDER — AMOXICILLIN-POT CLAVULANATE 875-125 MG PO TABS: 1.0000 | ORAL_TABLET | Freq: Two times a day (BID) | ORAL | 0 refills | Status: AC

## 2018-02-08 MED ORDER — AMOXICILLIN-POT CLAVULANATE 875-125 MG PO TABS
ORAL_TABLET | ORAL | Status: AC
  Filled 2018-02-08: qty 1

## 2018-02-08 MED ORDER — SULFAMETHOXAZOLE-TRIMETHOPRIM 800-160 MG PO TABS
2.0000 | ORAL_TABLET | Freq: Once | ORAL | Status: AC
  Administered 2018-02-08: 2 via ORAL

## 2018-02-08 NOTE — ED Notes (Signed)
First Nurse Note:  Patient presents to the ED after being called by Urgent Care.  Patient has a wound to left foot that urgent care xray showed may be osteomyelitis.  Patient is diabetic.  No obvious distress at this time.

## 2018-02-08 NOTE — ED Provider Notes (Signed)
[facility] Emergency Department Provider Note  ____________________________________________   First MD Initiated Contact with Patient 02/08/18 1912     (approximate)  I have reviewed the triage vital signs and the nursing notes.   HISTORY  Chief Complaint Wound Infection   HPI Arthur [name] is a 72 y.o. male who comes to the emergency department for possible osteomyelitis of his left great toe.  He has a past medical history of poorly controlled diabetes.  He has had several weeks of slowly progressive painful swelling to the plantar aspect of his left great toe.  Yesterday he went to urgent care who performed an x-ray which was concerning for osteomyelitis and he was advised to come to the emergency department.  He denies fevers or chills.   He has some numbness in his feet which is chronic.  The pain is mild severity throbbing aching worse with walking improved with rest.  He has no known vascular issues.   Past Medical History:  Diagnosis Date  . Diabetes (HCC)   . Hypertension     There are no active problems to display for this patient.   Past Surgical History:  Procedure Laterality Date  . KYPHOPLASTY N/A 01/23/2018   Procedure: [id number];  Surgeon: Kennedy Bucker, MD;  Location: [facility] ORS;  Service: Orthopedics;  Laterality: N/A;    Prior to Admission medications   Medication Sig Start Date End Date Taking? Authorizing Provider  amoxicillin-clavulanate (AUGMENTIN) 875-125 MG tablet Take 1 tablet by mouth 2 (two) times daily for 14 days. 02/08/18 02/22/18  Merrily Brittle, MD  aspirin EC 81 MG tablet Take 81 mg by mouth daily.    [provider]  atorvastatin (LIPITOR) 10 MG tablet Take 10 mg by mouth daily.    [provider]  glimepiride (AMARYL) 1 MG tablet Take 2 mg by mouth daily with breakfast.    [provider]  HYDROcodone-acetaminophen (NORCO) 5-325 MG tablet Take 1 tablet by mouth every 6 (six) hours as  needed for moderate pain. 01/23/18   Kennedy Bucker, MD  insulin detemir (LEVEMIR) 100 UNIT/ML injection Inject 15 Units into the skin at bedtime.    [provider]  metFORMIN (GLUCOPHAGE) 850 MG tablet Take 850 mg by mouth 2 (two) times daily with a meal.    [provider]  ondansetron (ZOFRAN) 4 MG tablet Take 4 mg by mouth every 8 (eight) hours as needed for nausea or vomiting.    [provider]  sertraline (ZOLOFT) 50 MG tablet Take 100 mg by mouth daily.    [provider]  sulfamethoxazole-trimethoprim (BACTRIM DS,SEPTRA DS) 800-160 MG tablet Take 2 tablets by mouth 2 (two) times daily for 14 days. 02/08/18 02/22/18  Merrily Brittle, MD  traMADol (ULTRAM) 50 MG tablet Take by mouth every 6 (six) hours as needed.    [provider]  vitamin B-12 (CYANOCOBALAMIN) 1000 MCG tablet Take 1,000 mcg by mouth daily.    [provider]    Allergies Patient has no known allergies.  No family history on file.  Social History Social History   Tobacco Use  . Smoking status: Former Smoker  Substance Use Topics  . Alcohol use: Not Currently  . Drug use: Never    Review of Systems Constitutional: No fever/chills Eyes: No visual changes. ENT: No sore throat. Cardiovascular: Denies chest pain. Respiratory: Denies shortness of breath. Gastrointestinal: No abdominal pain.  No nausea, no vomiting.  No diarrhea.  No constipation. Genitourinary: Negative for  dysuria. Musculoskeletal: Positive for toe pain Skin: Positive for wound Neurological: Negative for headaches, focal weakness or numbness.   ____________________________________________   PHYSICAL EXAM:  VITAL SIGNS: ED Triage Vitals  Enc Vitals Group     BP 02/08/18 1631 97/61     Pulse Rate 02/08/18 1631 84     Resp 02/08/18 1631 16     Temp 02/08/18 1631 98.4 F (36.9 C)     Temp Source 02/08/18 1631 Oral     SpO2 02/08/18 1631 98 %     Weight 02/08/18 1632 160 lb (72.6  kg)     Height 02/08/18 1632 5\' 11"  (1.803 m)     Head Circumference --      Peak Flow --      Pain Score 02/08/18 1631 4     Pain Loc --      Pain Edu? --      Excl. in GC? --     Constitutional: Alert and oriented x4 pleasant cooperative nontoxic Eyes: PERRL EOMI. Head: Atraumatic. Nose: No congestion/rhinnorhea. Mouth/Throat: No trismus Neck: No stridor.   Cardiovascular: Normal rate, regular rhythm. Grossly normal heart sounds.  Good peripheral circulation. Respiratory: Normal respiratory effort.  No retractions. Lungs CTAB and moving good air Gastrointestinal: Soft nontender Musculoskeletal: Nonpurulent non-foul-smelling ulceration to plantar aspect of left great toe.  2+ dorsalis pedis pulses Neurologic:  Normal speech and language. No gross focal neurologic deficits are appreciated. Skin:  Skin is warm, dry and intact. No rash noted. Psychiatric: Mood and affect are normal. Speech and behavior are normal.    ____________________________________________   DIFFERENTIAL includes but not limited to  Osteomyelitis, diabetic foot infection, claudication ____________________________________________   LABS (all labs ordered are listed, but only abnormal results are displayed)  Labs Reviewed  CBC WITH DIFFERENTIAL/PLATELET - Abnormal; Notable for the following components:      Result Value   WBC 13.3 (*)    RBC 3.72 (*)    Hemoglobin 11.8 (*)    HCT 34.5 (*)    Neutro Abs 7.5 (*)    Eosinophils Absolute 1.8 (*)    All other components within normal limits  COMPREHENSIVE METABOLIC PANEL - Abnormal; Notable for the following components:   Glucose, Bld 101 (*)    Creatinine, Ser 1.36 (*)    Alkaline Phosphatase 127 (*)    GFR calc non Af Amer 50 (*)    GFR calc Af Amer 58 (*)    All other components within normal limits    Lab work reviewed by me with elevated white count which is  nonspecific __________________________________________  EKG   ____________________________________________  RADIOLOGY  X-ray of the left foot reviewed by me with no evidence of osteomyelitis ____________________________________________   PROCEDURES  Procedure(s) performed: no  Procedures  Critical Care performed: no  ____________________________________________   INITIAL IMPRESSION / ASSESSMENT AND PLAN / ED COURSE  Pertinent labs & imaging results that were available during my care of the patient were reviewed by me and considered in my medical decision making (see chart for details).   The patient has an x-ray here today without concerns of osteomyelitis however I can see the results of his previous x-ray which she is raising concern for early osteomyelitis.  Regardless he has a strong pulse and is afebrile and neurovascularly intact and he has not had a course of appropriate antibiotics yet.  The patient would prefer to be treated as an outpatient and I agree.  I will treat him with Augmentin  and Bactrim and refer him to podiatry.  Strict return precautions have been given.      ____________________________________________   FINAL CLINICAL IMPRESSION(S) / ED DIAGNOSES  Final diagnoses:  Diabetic foot infection (HCC)  Other acute osteomyelitis of left foot (HCC)      NEW MEDICATIONS STARTED DURING THIS VISIT:  Discharge Medication List as of 02/08/2018  7:28 PM    START taking these medications   Details  amoxicillin-clavulanate (AUGMENTIN) 875-125 MG tablet Take 1 tablet by mouth 2 (two) times daily for 14 days., Starting Wed 02/08/2018, Until Wed 02/22/2018, Print    sulfamethoxazole-trimethoprim (BACTRIM DS,SEPTRA DS) 800-160 MG tablet Take 2 tablets by mouth 2 (two) times daily for 14 days., Starting Wed 02/08/2018, Until Wed 02/22/2018, Print         Note:  This document was prepared using Dragon voice recognition software and may include unintentional  dictation errors.     Merrily Brittleifenbark, Senaya Dicenso, MD 02/10/18 1421

## 2018-02-08 NOTE — ED Triage Notes (Signed)
Pt seen at [facility] for left great toe infection. Had an xray, unable to tell if infection is to the bone or not, daughter reports there was gas visible on the xray. Bandage in place. Pt is diabetic.

## 2018-02-08 NOTE — Discharge Instructions (Signed)
Please take both of your antibiotics as prescribed and make an appointment to follow-up with the podiatrist within 2 days for reevaluation.  Return to the emergency department sooner for any concerns.  It was a pleasure to take care of you today, and thank you for coming to our emergency department.  If you have any questions or concerns before leaving please ask the nurse to grab me and I'm more than happy to go through your aftercare instructions again.  If you were prescribed any opioid pain medication today such as Norco, Vicodin, Percocet, morphine, hydrocodone, or oxycodone please make sure you do not drive when you are taking this medication as it can alter your ability to drive safely.  If you have any concerns once you are home that you are not improving or are in fact getting worse before you can make it to your follow-up appointment, please do not hesitate to call 911 and come back for further evaluation.  Merrily Brittle, MD  Results for orders placed or performed during the hospital encounter of 02/08/18  CBC with Differential  Result Value Ref Range   WBC 13.3 (H) 3.8 - 10.6 K/uL   RBC 3.72 (L) 4.40 - 5.90 MIL/uL   Hemoglobin 11.8 (L) 13.0 - 18.0 g/dL   HCT 16.1 (L) 09.6 - 04.5 %   MCV 92.8 80.0 - 100.0 fL   MCH 31.8 26.0 - 34.0 pg   MCHC 34.3 32.0 - 36.0 g/dL   RDW 40.9 81.1 - 91.4 %   Platelets 302 150 - 440 K/uL   Neutrophils Relative % 56 %   Neutro Abs 7.5 (H) 1.4 - 6.5 K/uL   Lymphocytes Relative 23 %   Lymphs Abs 3.0 1.0 - 3.6 K/uL   Monocytes Relative 6 %   Monocytes Absolute 0.8 0.2 - 1.0 K/uL   Eosinophils Relative 14 %   Eosinophils Absolute 1.8 (H) 0 - 0.7 K/uL   Basophils Relative 1 %   Basophils Absolute 0.1 0 - 0.1 K/uL  Comprehensive metabolic panel  Result Value Ref Range   Sodium 142 135 - 145 mmol/L   Potassium 4.3 3.5 - 5.1 mmol/L   Chloride 108 98 - 111 mmol/L   CO2 26 22 - 32 mmol/L   Glucose, Bld 101 (H) 70 - 99 mg/dL   BUN 23 8 - 23 mg/dL   Creatinine, Ser 7.82 (H) 0.61 - 1.24 mg/dL   Calcium 9.1 8.9 - 95.6 mg/dL   Total Protein 7.2 6.5 - 8.1 g/dL   Albumin 3.9 3.5 - 5.0 g/dL   AST 20 15 - 41 U/L   ALT 12 0 - 44 U/L   Alkaline Phosphatase 127 (H) 38 - 126 U/L   Total Bilirubin 0.7 0.3 - 1.2 mg/dL   GFR calc non Af Amer 50 (L) >60 mL/min   GFR calc Af Amer 58 (L) >60 mL/min   Anion gap 8 5 - 15   Dg Lumbar Spine 2-3 Views  Result Date: 01/23/2018 CLINICAL DATA:  72 year old male with L1 compression fracture. Subsequent encounter. EXAM: LUMBAR SPINE - 2-3 VIEW; DG C-ARM 61-120 MIN Fluoroscopic time: 1 minutes and 22 seconds. COMPARISON:  01/19/2018 MR. FINDINGS: Two intraoperative C-arm views submitted for review after surgery. Cement placed into compressed T1 vertebral body. Level assignment utilizing ribs and fact that L1 was compressed on recent MR. Tiny amount of contrast projects over the posterior aspect of the vertebral body and may extend to the ventral aspect of the canal or  within the pedicle. IMPRESSION: Post cement augmentation L1 compression fracture.  Please see above. Electronically Signed   By: Lacy Duverney M.D.   On: 01/23/2018 17:29   Mr Lumbar Spine Wo Contrast  Result Date: 01/19/2018 CLINICAL DATA:  Lifting injury 1 month ago. Low back pain. Evaluate L1 compression fracture. EXAM: MRI LUMBAR SPINE WITHOUT CONTRAST TECHNIQUE: Multiplanar, multisequence MR imaging of the lumbar spine was performed. No intravenous contrast was administered. COMPARISON:  CT chest January 05, 2018 FINDINGS: SEGMENTATION: For the purposes of this report, the last well-formed intervertebral disc is reported as L5-S1. ALIGNMENT: Maintained lumbar lordosis. No malalignment. VERTEBRAE:Acute to subacute appearing L1 compression fracture with 50-75% height loss superior endplate. Heterogeneous signal T12-L1 disc consistent with injury, potential hematoma. No retropulsed bony fragments. Remaining lumbar vertebral bodies intact. Generally bright T1  and T2 bone marrow signal compatible with osteopenia. L1-2 through L5-S1 disc morphology is maintained with mild desiccation L1-2 through L4-5 discs. Mild congenital canal narrowing on the basis of foreshortened pedicles. CONUS MEDULLARIS AND CAUDA EQUINA: Conus medullaris terminates at L1-2 and demonstrates normal morphology and signal characteristics. Cauda equina is normal. PARASPINAL AND OTHER SOFT TISSUES: Faintly increased STIR signal ileo psoas muscles at L1-2, probable low grade strain. DISC LEVELS: T12-L1: Annular bulging and mild facet arthropathy. No canal stenosis or neural foraminal narrowing. L1-2: Annular bulging, mild facet arthropathy. No canal stenosis. Minimal neural foraminal narrowing. L2-3: Annular bulging. Small LEFT extraforaminal disc protrusion contacting the exited LEFT L2 nerve. Mild facet arthropathy and ligamentum flavum redundancy. Mild canal stenosis. Mild RIGHT, mild to moderate LEFT neural foraminal narrowing. L3-4: Annular bulging with small RIGHT subarticular disc protrusion. Mild facet arthropathy and ligamentum flavum redundancy. Moderate canal stenosis. Mild-to-moderate RIGHT, mild LEFT neural foraminal narrowing. L4-5: Small broad-based disc bulge. Mild facet arthropathy and ligamentum flavum redundancy. Moderate canal stenosis. Mild-to-moderate RIGHT, moderate LEFT neural foraminal narrowing. L5-S1: No disc bulge. Minimal facet arthropathy without canal stenosis or neural foraminal narrowing. IMPRESSION: 1. Moderate to severe acute to subacute L1 compression fracture with T12-L1 disc injury. 2. 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 3. Moderate canal stenosis L3-4 and L4-5.  Mild canal stenosis L2-3. 4. Neural foraminal narrowing L1-2 through L4-5: Moderate on the LEFT at L4-5. Encroachment upon the exited LEFT L2 nerve. Electronically Signed   By: Awilda Metro M.D.   On: 01/19/2018 15:35   Dg Toe Great Left  Result Date:  02/08/2018 CLINICAL DATA:  One-week history of LEFT great toe pain and swelling. Current history of diabetes and gout. EXAM: LEFT GREAT TOE 3  VIEWS COMPARISON:  None. FINDINGS: Osseous demineralization. Mild hallux valgus. Diffuse soft tissue swelling. Possible gas in the soft tissues adjacent to the IP joint. No evidence of underlying osteomyelitis. No evidence of acute or subacute fracture or dislocation. No erosive changes to confirm active gout. IMPRESSION: 1. No acute osseous abnormality. 2. Osseous demineralization. 3. Diffuse soft tissue swelling and possible soft tissue ulceration adjacent to the IP joint without evidence of underlying osteomyelitis. Electronically Signed   By: Hulan Saas M.D.   On: 02/08/2018 17:31   Dg C-arm 1-60 Min  Result Date: 01/23/2018 CLINICAL DATA:  72 year old male with L1 compression fracture. Subsequent encounter. EXAM: LUMBAR SPINE - 2-3 VIEW; DG C-ARM 61-120 MIN Fluoroscopic time: 1 minutes and 22 seconds. COMPARISON:  01/19/2018 MR. FINDINGS: Two intraoperative C-arm views submitted for review after surgery. Cement placed into compressed T1 vertebral body. Level assignment utilizing ribs and fact that L1  was compressed on recent MR. Tiny amount of contrast projects over the posterior aspect of the vertebral body and may extend to the ventral aspect of the canal or within the pedicle. IMPRESSION: Post cement augmentation L1 compression fracture.  Please see above. Electronically Signed   By: Lacy DuverneySteven  Olson M.D.   On: 01/23/2018 17:29

## 2018-02-08 NOTE — ED Notes (Signed)
Pt has open wound to top of left great toe.  Sx for 2 weeks pt was seen at urgent care today.  Sent to er for eval.  Pt alert.

## 2018-12-30 ENCOUNTER — Other Ambulatory Visit: Payer: Self-pay

## 2018-12-30 ENCOUNTER — Inpatient Hospital Stay
Admission: EM | Admit: 2018-12-30 | Discharge: 2019-01-04 | DRG: 481 | Disposition: A | Discharge status: Skilled Nursing Facility | Payer: Medicare Other | Attending: Internal Medicine | Admitting: Internal Medicine

## 2018-12-30 ENCOUNTER — Emergency Department: Payer: Medicare Other

## 2018-12-30 ENCOUNTER — Encounter: Payer: Self-pay | Admitting: Emergency Medicine

## 2018-12-30 DIAGNOSIS — N320 Bladder-neck obstruction: Secondary | ICD-10-CM | POA: Diagnosis present

## 2018-12-30 DIAGNOSIS — I69398 Other sequelae of cerebral infarction: Secondary | ICD-10-CM | POA: Diagnosis not present

## 2018-12-30 DIAGNOSIS — S72009A Fracture of unspecified part of neck of unspecified femur, initial encounter for closed fracture: Secondary | ICD-10-CM | POA: Diagnosis present

## 2018-12-30 DIAGNOSIS — E119 Type 2 diabetes mellitus without complications: Secondary | ICD-10-CM | POA: Diagnosis present

## 2018-12-30 DIAGNOSIS — S72002A Fracture of unspecified part of neck of left femur, initial encounter for closed fracture: Secondary | ICD-10-CM

## 2018-12-30 DIAGNOSIS — Y92009 Unspecified place in unspecified non-institutional (private) residence as the place of occurrence of the external cause: Secondary | ICD-10-CM | POA: Diagnosis not present

## 2018-12-30 DIAGNOSIS — K5900 Constipation, unspecified: Secondary | ICD-10-CM | POA: Diagnosis present

## 2018-12-30 DIAGNOSIS — I10 Essential (primary) hypertension: Secondary | ICD-10-CM | POA: Diagnosis present

## 2018-12-30 DIAGNOSIS — S72142A Displaced intertrochanteric fracture of left femur, initial encounter for closed fracture: Principal | ICD-10-CM | POA: Diagnosis present

## 2018-12-30 DIAGNOSIS — R52 Pain, unspecified: Secondary | ICD-10-CM | POA: Diagnosis present

## 2018-12-30 DIAGNOSIS — Z794 Long term (current) use of insulin: Secondary | ICD-10-CM | POA: Diagnosis not present

## 2018-12-30 DIAGNOSIS — E860 Dehydration: Secondary | ICD-10-CM | POA: Diagnosis present

## 2018-12-30 DIAGNOSIS — N136 Pyonephrosis: Secondary | ICD-10-CM | POA: Diagnosis present

## 2018-12-30 DIAGNOSIS — Z87891 Personal history of nicotine dependence: Secondary | ICD-10-CM

## 2018-12-30 DIAGNOSIS — N179 Acute kidney failure, unspecified: Secondary | ICD-10-CM | POA: Diagnosis not present

## 2018-12-30 DIAGNOSIS — F0280 Dementia in other diseases classified elsewhere without behavioral disturbance: Secondary | ICD-10-CM | POA: Diagnosis present

## 2018-12-30 DIAGNOSIS — Z1159 Encounter for screening for other viral diseases: Secondary | ICD-10-CM

## 2018-12-30 DIAGNOSIS — B961 Klebsiella pneumoniae [K. pneumoniae] as the cause of diseases classified elsewhere: Secondary | ICD-10-CM | POA: Diagnosis present

## 2018-12-30 DIAGNOSIS — W010XXA Fall on same level from slipping, tripping and stumbling without subsequent striking against object, initial encounter: Secondary | ICD-10-CM | POA: Diagnosis present

## 2018-12-30 DIAGNOSIS — D62 Acute posthemorrhagic anemia: Secondary | ICD-10-CM | POA: Diagnosis not present

## 2018-12-30 DIAGNOSIS — N390 Urinary tract infection, site not specified: Secondary | ICD-10-CM

## 2018-12-30 DIAGNOSIS — E876 Hypokalemia: Secondary | ICD-10-CM | POA: Diagnosis not present

## 2018-12-30 DIAGNOSIS — Z981 Arthrodesis status: Secondary | ICD-10-CM

## 2018-12-30 DIAGNOSIS — Z79899 Other long term (current) drug therapy: Secondary | ICD-10-CM | POA: Diagnosis not present

## 2018-12-30 DIAGNOSIS — G309 Alzheimer's disease, unspecified: Secondary | ICD-10-CM | POA: Diagnosis present

## 2018-12-30 HISTORY — DX: Dementia in other diseases classified elsewhere, unspecified severity, without behavioral disturbance, psychotic disturbance, mood disturbance, and anxiety: F02.80

## 2018-12-30 LAB — SAMPLE TO BLOOD BANK

## 2018-12-30 LAB — SURGICAL PCR SCREEN
MRSA, PCR: NEGATIVE
Staphylococcus aureus: NEGATIVE

## 2018-12-30 LAB — GLUCOSE, CAPILLARY
Glucose-Capillary: 295 mg/dL — ABNORMAL HIGH (ref 70–99)
Glucose-Capillary: 295 mg/dL — ABNORMAL HIGH (ref 70–99)

## 2018-12-30 LAB — URINALYSIS, COMPLETE (UACMP) WITH MICROSCOPIC
Bacteria, UA: NONE SEEN
Bilirubin Urine: NEGATIVE
Glucose, UA: >=500 mg/dL — AB
Ketones, ur: NEGATIVE mg/dL
Nitrite: POSITIVE — AB
Protein, ur: 30 mg/dL — AB
Specific Gravity, Urine: 1.022 (ref 1.005–1.030)
pH: 5 (ref 5.0–8.0)

## 2018-12-30 LAB — BASIC METABOLIC PANEL
Anion gap: 14 (ref 5–15)
BUN: 28 mg/dL — ABNORMAL HIGH (ref 8–23)
CO2: 22 mmol/L (ref 22–32)
Calcium: 9.2 mg/dL (ref 8.9–10.3)
Chloride: 99 mmol/L (ref 98–111)
Creatinine, Ser: 1.09 mg/dL (ref 0.61–1.24)
GFR calc Af Amer: >60 mL/min (ref >60)
GFR calc non Af Amer: >60 mL/min (ref >60)
Glucose, Bld: 279 mg/dL — ABNORMAL HIGH (ref 70–99)
Potassium: 3.7 mmol/L (ref 3.5–5.1)
Sodium: 135 mmol/L (ref 135–145)

## 2018-12-30 LAB — CBC
HCT: 36 % — ABNORMAL LOW (ref 39.0–52.0)
Hemoglobin: 12.4 g/dL — ABNORMAL LOW (ref 13.0–17.0)
MCH: 30.6 pg (ref 26.0–34.0)
MCHC: 34.4 g/dL (ref 30.0–36.0)
MCV: 88.9 fL (ref 80.0–100.0)
Platelets: 279 10*3/uL (ref 150–400)
RBC: 4.05 MIL/uL — ABNORMAL LOW (ref 4.22–5.81)
RDW: 13.2 % (ref 11.5–15.5)
WBC: 20.3 10*3/uL — ABNORMAL HIGH (ref 4.0–10.5)
nRBC: 0 % (ref 0.0–0.2)

## 2018-12-30 LAB — PROTIME-INR
INR: 1.1 (ref 0.8–1.2)
Prothrombin Time: 13.9 seconds (ref 11.4–15.2)

## 2018-12-30 LAB — CK: Total CK: 90 U/L (ref 49–397)

## 2018-12-30 LAB — SARS CORONAVIRUS 2 BY RT PCR (HOSPITAL ORDER, PERFORMED IN ~~LOC~~ HOSPITAL LAB): SARS Coronavirus 2: NEGATIVE

## 2018-12-30 MED ORDER — ACETAMINOPHEN 325 MG PO TABS
650.0000 mg | ORAL_TABLET | Freq: Four times a day (QID) | ORAL | Status: DC | PRN
  Filled 2018-12-30: qty 2

## 2018-12-30 MED ORDER — ONDANSETRON HCL 4 MG/2ML IJ SOLN
4.0000 mg | Freq: Four times a day (QID) | INTRAMUSCULAR | Status: DC | PRN
  Administered 2018-12-31: 4 mg via INTRAVENOUS
  Filled 2018-12-30 (×2): qty 2

## 2018-12-30 MED ORDER — MORPHINE SULFATE (PF) 2 MG/ML IV SOLN
2.0000 mg | Freq: Once | INTRAVENOUS | Status: AC
  Administered 2018-12-30: 10:00:00 2 mg via INTRAVENOUS
  Filled 2018-12-30: qty 1

## 2018-12-30 MED ORDER — CEFAZOLIN SODIUM-DEXTROSE 1-4 GM/50ML-% IV SOLN
1.0000 g | Freq: Once | INTRAVENOUS | Status: AC
  Administered 2018-12-31: 1 g via INTRAVENOUS
  Filled 2018-12-30 (×2): qty 50

## 2018-12-30 MED ORDER — SERTRALINE HCL 50 MG PO TABS
100.0000 mg | ORAL_TABLET | Freq: Every day | ORAL | Status: DC
  Administered 2019-01-01 – 2019-01-04 (×4): 100 mg via ORAL
  Filled 2018-12-30 (×4): qty 2

## 2018-12-30 MED ORDER — SENNOSIDES-DOCUSATE SODIUM 8.6-50 MG PO TABS: 1.0000 | ORAL_TABLET | Freq: Every evening | ORAL | Status: DC | PRN

## 2018-12-30 MED ORDER — VITAMIN B-12 1000 MCG PO TABS
1000.0000 ug | ORAL_TABLET | Freq: Every day | ORAL | Status: DC
  Administered 2019-01-01 – 2019-01-04 (×4): 1000 ug via ORAL
  Filled 2018-12-30 (×4): qty 1

## 2018-12-30 MED ORDER — ONDANSETRON HCL 4 MG/2ML IJ SOLN
4.0000 mg | Freq: Once | INTRAMUSCULAR | Status: AC
  Administered 2018-12-30: 4 mg via INTRAVENOUS
  Filled 2018-12-30: qty 2

## 2018-12-30 MED ORDER — INSULIN ASPART 100 UNIT/ML ~~LOC~~ SOLN
0.0000 [IU] | Freq: Three times a day (TID) | SUBCUTANEOUS | Status: DC
  Administered 2018-12-31: 3 [IU] via SUBCUTANEOUS
  Administered 2018-12-31: 08:00:00 9 [IU] via SUBCUTANEOUS
  Administered 2019-01-01: 2 [IU] via SUBCUTANEOUS
  Administered 2019-01-01: 5 [IU] via SUBCUTANEOUS
  Administered 2019-01-01: 2 [IU] via SUBCUTANEOUS
  Administered 2019-01-02 (×2): 3 [IU] via SUBCUTANEOUS
  Administered 2019-01-02 – 2019-01-03 (×3): 2 [IU] via SUBCUTANEOUS
  Administered 2019-01-04: 3 [IU] via SUBCUTANEOUS
  Filled 2018-12-30 (×11): qty 1

## 2018-12-30 MED ORDER — SODIUM CHLORIDE 0.9 % IV SOLN
1.0000 g | INTRAVENOUS | Status: DC
  Administered 2018-12-31 – 2019-01-02 (×3): 1 g via INTRAVENOUS
  Filled 2018-12-30 (×3): qty 1

## 2018-12-30 MED ORDER — ONDANSETRON HCL 4 MG PO TABS: 4.0000 mg | ORAL_TABLET | Freq: Four times a day (QID) | ORAL | Status: DC | PRN

## 2018-12-30 MED ORDER — SODIUM CHLORIDE 0.9 % IV SOLN
INTRAVENOUS | Status: DC
  Administered 2018-12-30 (×3): via INTRAVENOUS

## 2018-12-30 MED ORDER — INSULIN ASPART 100 UNIT/ML ~~LOC~~ SOLN
0.0000 [IU] | Freq: Every day | SUBCUTANEOUS | Status: DC
  Administered 2018-12-30: 22:00:00 3 [IU] via SUBCUTANEOUS
  Administered 2018-12-31: 2 [IU] via SUBCUTANEOUS
  Filled 2018-12-30 (×2): qty 1

## 2018-12-30 MED ORDER — ACETAMINOPHEN 650 MG RE SUPP: 650.0000 mg | Freq: Four times a day (QID) | RECTAL | Status: DC | PRN

## 2018-12-30 MED ORDER — SODIUM CHLORIDE 0.9 % IV SOLN
250.0000 mL | INTRAVENOUS | Status: DC | PRN
  Administered 2018-12-31: 08:00:00 via INTRAVENOUS

## 2018-12-30 MED ORDER — SODIUM CHLORIDE 0.9 % IV SOLN
1.0000 g | Freq: Once | INTRAVENOUS | Status: AC
  Administered 2018-12-30: 14:00:00 1 g via INTRAVENOUS
  Filled 2018-12-30: qty 10

## 2018-12-30 MED ORDER — SODIUM CHLORIDE 0.9% FLUSH
3.0000 mL | Freq: Two times a day (BID) | INTRAVENOUS | Status: DC
  Administered 2018-12-30 – 2019-01-04 (×7): 3 mL via INTRAVENOUS

## 2018-12-30 MED ORDER — ATORVASTATIN CALCIUM 10 MG PO TABS
10.0000 mg | ORAL_TABLET | Freq: Every day | ORAL | Status: DC
  Administered 2018-12-30 – 2019-01-03 (×5): 10 mg via ORAL
  Filled 2018-12-30 (×5): qty 1

## 2018-12-30 MED ORDER — TRAMADOL HCL 50 MG PO TABS
50.0000 mg | ORAL_TABLET | Freq: Four times a day (QID) | ORAL | Status: DC | PRN
  Administered 2018-12-30 – 2019-01-01 (×3): 50 mg via ORAL
  Filled 2018-12-30 (×3): qty 1

## 2018-12-30 MED ORDER — MORPHINE SULFATE (PF) 2 MG/ML IV SOLN
2.0000 mg | INTRAVENOUS | Status: DC | PRN
  Administered 2018-12-30 – 2018-12-31 (×4): 2 mg via INTRAVENOUS
  Filled 2018-12-30 (×4): qty 1

## 2018-12-30 MED ORDER — QUETIAPINE FUMARATE 25 MG PO TABS
25.0000 mg | ORAL_TABLET | Freq: Every evening | ORAL | Status: DC | PRN
  Administered 2018-12-30 – 2019-01-01 (×3): 25 mg via ORAL
  Filled 2018-12-30 (×3): qty 1

## 2018-12-30 MED ORDER — ONDANSETRON HCL 4 MG/2ML IJ SOLN
4.0000 mg | Freq: Once | INTRAMUSCULAR | Status: AC
  Administered 2018-12-30: 10:00:00 4 mg via INTRAVENOUS
  Filled 2018-12-30: qty 2

## 2018-12-30 MED ORDER — SODIUM CHLORIDE 0.9% FLUSH: 3.0000 mL | INTRAVENOUS | Status: DC | PRN

## 2018-12-30 MED ORDER — SODIUM CHLORIDE 0.9 % IV BOLUS
500.0000 mL | Freq: Once | INTRAVENOUS | Status: AC
Start: 2018-12-30 — End: 2018-12-30
  Administered 2018-12-30: 500 mL via INTRAVENOUS

## 2018-12-30 NOTE — Consult Note (Signed)
Patient is seen for evaluation and treatment of a left intertrochanteric hip fracture.  He is very confused cannot give me his address and does not recall a fall is uncertain exactly what time he fell.  Is found on the floor and is being admitted for treatment of this left hip fracture.  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  On exam his left leg is shortened and externally rotated he is unable to flex extend the ankle secondary to the fusion but is able to flex and extend his toes is a trace dorsalis pedis pulse and brisk capillary refill to the toes. Skin around the hip is intact with no evidence of pressure sores in the lateral hip  Radiographs show a high intertrochanteric hip fracture without comminution  Impression is left hip fracture patient is ambulatory although very confused and living independently  Plan is for open reduction internal fixation tomorrow morning after medical eval he apparently also has UTI and that will be treated as well preop.

## 2018-12-30 NOTE — ED Notes (Signed)
Pt has on clean gown and now has  Dry linen.

## 2018-12-30 NOTE — ED Notes (Signed)
ED TO INPATIENT HANDOFF REPORT  ED Nurse Name and Phone #: Takita Riecke [phone]  S Name/Age/Gender Arthur [name] 73 y.o. male Room/Bed: ED19A/ED19A  Code Status   Code Status: Not on file  Home/SNF/Other Home Patient oriented to: self a&ox self only Is this baseline? Yes   Triage Complete: Triage complete  Chief Complaint Fall   Triage Note Pt here after unwitnessed fall.  Family found at 8 am today. Urine all over pants and shirt. Clothes removed, pt cleaned and changed. Pt has dementia at baseline.   Allergies No Known Allergies  Level of Care/Admitting Diagnosis ED Disposition    ED Disposition Condition Comment   Admit  Hospital Area: [facility] [[id number]]  Level of Care: Med-Surg [16]  Covid Evaluation: N/A  Diagnosis: Hip fracture [facility](HCC) [[id number]]  Admitting Physician: Ihor AustinPYREDDY, PAVAN [[id number]]  Attending Physician: Ihor AustinPYREDDY, PAVAN [[id number]]  Estimated length of stay: past midnight tomorrow  Certification:: I certify this patient will need inpatient services for at least 2 midnights  PT Class (Do Not Modify): Inpatient [101]  PT Acc Code (Do Not Modify): Private [1]       B Medical/Surgery History Past Medical History:  Diagnosis Date  . Alzheimer disease (HCC)   . Diabetes (HCC)   . Hypertension    Past Surgical History:  Procedure Laterality Date  . KYPHOPLASTY N/A 01/23/2018   Procedure: [id number];  Surgeon: Kennedy BuckerMenz, Michael, MD;  Location: [facility] ORS;  Service: Orthopedics;  Laterality: N/A;     A IV Location/Drains/Wounds Patient Lines/Drains/Airways Status   Active Line/Drains/Airways    Name:   Placement date:   Placement time:   Site:   Days:   Peripheral IV 12/30/18 Left Antecubital   12/30/18    1357    Antecubital   less than 1          Intake/Output Last 24 hours No intake or output data in the 24 hours ending 12/30/18 1551  Labs/Imaging Results for orders placed or performed during the hospital encounter of 12/30/18  (from the past 48 hour(s))  CBC     Status: Abnormal   Collection Time: 12/30/18  9:23 AM  Result Value Ref Range   WBC 20.3 (H) 4.0 - 10.5 K/uL   RBC 4.05 (L) 4.22 - 5.81 MIL/uL   Hemoglobin 12.4 (L) 13.0 - 17.0 g/dL   HCT 28.436.0 (L) 13.239.0 - 44.052.0 %   MCV 88.9 80.0 - 100.0 fL   MCH 30.6 26.0 - 34.0 pg   MCHC 34.4 30.0 - 36.0 g/dL   RDW 10.213.2 72.511.5 - 36.615.5 %   Platelets 279 150 - 400 K/uL   nRBC 0.0 0.0 - 0.2 %    Comment: Performed at [facility], [address] [place], [place] [address]  Basic metabolic panel     Status: Abnormal   Collection Time: 12/30/18  9:23 AM  Result Value Ref Range   Sodium 135 135 - 145 mmol/L   Potassium 3.7 3.5 - 5.1 mmol/L   Chloride 99 98 - 111 mmol/L   CO2 22 22 - 32 mmol/L   Glucose, Bld 279 (H) 70 - 99 mg/dL   BUN 28 (H) 8 - 23 mg/dL   Creatinine, Ser 7.421.09 0.61 - 1.24 mg/dL   Calcium 9.2 8.9 - 59.510.3 mg/dL   GFR calc non Af Amer >60 >60 mL/min   GFR calc Af Amer >60 >60 mL/min   Anion gap 14 5 - 15    Comment: Performed at [facility], [address] [place], [place] [address]  CK     Status: None   Collection Time: 12/30/18  9:23 AM  Result Value Ref Range   Total CK 90 49 - 397 U/L    Comment: Performed at [facility], [address] [place], [place] [address]  Protime-INR     Status: None   Collection Time: 12/30/18  9:23 AM  Result Value Ref Range   Prothrombin Time 13.9 11.4 - 15.2 seconds   INR 1.1 0.8 - 1.2    Comment: (NOTE) INR goal varies based on device and disease states. Performed at [facility], [address] [place], [place] [address]   Sample to Blood Bank     Status: None   Collection Time: 12/30/18  9:23 AM  Result Value Ref Range   Blood Bank Specimen SAMPLE AVAILABLE FOR TESTING    Sample Expiration      01/02/2019,2359 Performed at [facility], [address] [place], [place] [address]   Urinalysis, Complete w Microscopic     Status: Abnormal   Collection  Time: 12/30/18 10:22 AM  Result Value Ref Range   Color, Urine YELLOW (A) YELLOW   APPearance HAZY (A) CLEAR   Specific Gravity, Urine 1.022 1.005 - 1.030   pH 5.0 5.0 - 8.0   Glucose, UA >=500 (A) NEGATIVE mg/dL   Hgb urine dipstick MODERATE (A) NEGATIVE   Bilirubin Urine NEGATIVE NEGATIVE   Ketones, ur NEGATIVE NEGATIVE mg/dL   Protein, ur 30 (A) NEGATIVE mg/dL   Nitrite POSITIVE (A) NEGATIVE   Leukocytes,Ua SMALL (A) NEGATIVE   RBC / HPF 6-10 0 - 5 RBC/hpf   WBC, UA 21-50 0 - 5 WBC/hpf   Bacteria, UA NONE SEEN NONE SEEN   Squamous Epithelial / LPF 0-5 0 - 5   Mucus PRESENT     Comment: Performed at [facility], [address] [place], [place] [address]  SARS Coronavirus 2 (CEPHEID - Performed in [facility] hospital lab), Hosp Order     Status: None   Collection Time: 12/30/18 12:07 PM  Result Value Ref Range   SARS Coronavirus 2 NEGATIVE NEGATIVE    Comment: (NOTE) If result is NEGATIVE SARS-CoV-2 target nucleic acids are NOT DETECTED. The SARS-CoV-2 RNA is generally detectable in upper and lower  respiratory specimens during the acute phase of infection. The lowest  concentration of SARS-CoV-2 viral copies this assay can detect is 250  copies / mL. A negative result does not preclude SARS-CoV-2 infection  and should not be used as the sole basis for treatment or other  patient management decisions.  A negative result may occur with  improper specimen collection / handling, submission of specimen other  than nasopharyngeal swab, presence of viral mutation(s) within the  areas targeted by this assay, and inadequate number of viral copies  (<250 copies / mL). A negative result must be combined with clinical  observations, patient history, and epidemiological information. If result is POSITIVE SARS-CoV-2 target nucleic acids are DETECTED. The SARS-CoV-2 RNA is generally detectable in upper and lower  respiratory specimens dur ing the acute phase of infection.   Positive  results are indicative of active infection with SARS-CoV-2.  Clinical  correlation with patient history and other diagnostic information is  necessary to determine patient infection status.  Positive results do  not rule out bacterial infection or co-infection with other viruses. If result is PRESUMPTIVE POSTIVE SARS-CoV-2 nucleic acids MAY BE PRESENT.  A presumptive positive result was obtained on the submitted specimen  and confirmed on repeat testing.  While 2019 novel coronavirus  (SARS-CoV-2) nucleic acids may be present in the submitted sample  additional confirmatory testing may be necessary for epidemiological  and / or clinical management purposes  to differentiate between  SARS-CoV-2 and other Sarbecovirus currently known to infect humans.  If clinically indicated additional testing with an alternate test  methodology [phone]) is advised. The SARS-CoV-2 RNA is generally  detectable in upper and lower respiratory sp ecimens during the acute  phase of infection. The expected result is Negative. Fact Sheet for Patients:  BoilerBrush.com.cy Fact Sheet for Healthcare Providers: https://pope.com/ This test is not yet approved or cleared by the [place] FDA and has been authorized for detection and/or diagnosis of SARS-CoV-2 by FDA under an Emergency Use Authorization (EUA).  This EUA will remain in effect (meaning this test can be used) for the duration of the COVID-19 declaration under Section 564(b)(1) of the Act, 21 U.S.C. section 360bbb-3(b)(1), unless the authorization is terminated or revoked sooner. Performed at [facility], [address] [place], [place] [address]    Dg Tibia/fibula Left  Result Date: 12/30/2018 CLINICAL DATA:  Left leg pain after fall EXAM: LEFT TIBIA AND FIBULA - 2 VIEW COMPARISON:  None. FINDINGS: Postsurgical changes at the ankle with tibiotalar fusion. No evidence of  hardware loosening or fracture. Degenerative changes of the knee, better evaluated on dedicated knee radiographs. No evidence of fracture or dislocation. IMPRESSION: Postsurgical changes at the ankle. Degenerative changes at the knee. No evidence of fracture or dislocation. Electronically Signed   By: Charline Bills M.D.   On: 12/30/2018 10:41   Ct Head Wo Contrast  Result Date: 12/30/2018 CLINICAL DATA:  Unwitnessed fall, dementia EXAM: CT HEAD WITHOUT CONTRAST TECHNIQUE: Contiguous axial images were obtained from the base of the skull through the vertex without intravenous contrast. COMPARISON:  None. FINDINGS: Motion degraded images. Brain: No evidence of acute infarction, hemorrhage, hydrocephalus, extra-axial collection or mass lesion/mass effect. Subcortical white matter and periventricular small vessel ischemic changes. Global cortical and central atrophy. Vascular: Intracranial atherosclerosis. Skull: Normal. Negative for fracture or focal lesion. Sinuses/Orbits: The visualized paranasal sinuses are essentially clear. The mastoid air cells are unopacified. Other: None. IMPRESSION: Motion degraded images. No evidence of acute intracranial abnormality. Atrophy with small vessel ischemic changes. Electronically Signed   By: Charline Bills M.D.   On: 12/30/2018 10:15   Dg Knee Complete 4 Views Left  Result Date: 12/30/2018 CLINICAL DATA:  Left leg pain after fall EXAM: LEFT KNEE - COMPLETE 4+ VIEW COMPARISON:  None. FINDINGS: No fracture or dislocation is seen. Moderate tricompartmental degenerative changes, most prominent in the medial compartment. Chondrocalcinosis in the medial and lateral compartments. Visualized soft tissues are within normal limits. No definite suprapatellar knee joint effusion. IMPRESSION: No fracture or dislocation is seen. 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 Electronically Signed   By: Charline Bills M.D.   On: 12/30/2018 10:40   Dg  Foot 2 Views Left  Result Date: 12/30/2018 CLINICAL DATA:  Left leg pain after fall EXAM: LEFT FOOT - 2 VIEW COMPARISON:  None. FINDINGS: Status post tibiotalar fusion. No evidence of hardware loosening or fracture. No evidence of fracture or dislocation. The joint spaces are preserved. The visualized soft tissues are unremarkable. IMPRESSION: Status post tibiotalar fusion.  No evidence of complication. No fracture or dislocation is seen. Electronically Signed   By: Roselie Awkward.D.  On: 12/30/2018 10:41   Dg Hip Unilat W Or Wo Pelvis 2-3 Views Left  Result Date: 12/30/2018 CLINICAL DATA:  Left leg pain after fall EXAM: DG HIP (WITH OR WITHOUT PELVIS) 2-3V LEFT COMPARISON:  None. FINDINGS: Nondisplaced intertrochanteric left hip fracture. Bilateral hip joint spaces are preserved. Visualized bony pelvis appears intact. IMPRESSION: Nondisplaced intertrochanteric left hip fracture. Electronically Signed   By: Charline Bills M.D.   On: 12/30/2018 10:39    Pending Labs Unresulted Labs (From admission, onward)    Start     Ordered   12/30/18 1207  Urine Culture  Add-on,   AD    Question:  Patient immune status  Answer:  Normal   12/30/18 1206   Signed and Held  Basic metabolic panel  Tomorrow morning,   R     Signed and Held   Signed and Held  CBC  Tomorrow morning,   R     Signed and Held          Vitals/Pain Today's Vitals   12/30/18 1100 12/30/18 1200 12/30/18 1300 12/30/18 1501  BP: 120/78 (!) 137/103 (!) 127/93 98/76  Pulse:      Resp: Temp:      TempSrc:      SpO2:      Weight:      Height:        Isolation Precautions No active isolations  Medications Medications  morphine 2 MG/ML injection 2 mg (2 mg Intravenous Given 12/30/18 1446)  ceFAZolin (ANCEF) IVPB 1 g/50 mL premix (has no administration in time range)  sodium chloride 0.9 % bolus 500 mL (0 mLs Intravenous Stopped 12/30/18 1038)  morphine 2 MG/ML injection 2 mg (2 mg Intravenous Given 12/30/18  0933)  ondansetron (ZOFRAN) injection 4 mg (4 mg Intravenous Given 12/30/18 0933)  ondansetron (ZOFRAN) injection 4 mg (4 mg Intravenous Given 12/30/18 1018)  cefTRIAXone (ROCEPHIN) 1 g in sodium chloride 0.9 % 100 mL IVPB (0 g Intravenous Stopped 12/30/18 1454)    Mobility walks with person assist High fall risk   Focused Assessments ortho   R Recommendations: See Admitting Provider Note  Report given to:   Additional Notes:

## 2018-12-30 NOTE — ED Notes (Signed)
Pt found lying in bed holding his IV catheter in his hand. Darl Pikes, RN made aware, safety sitter placed with pt

## 2018-12-30 NOTE — ED Notes (Signed)
Report received. Pt is from home - hx dementia, lives alone. Found on floor by family with unknown down time. Pt has bed alarm on. Possibly fx hip.

## 2018-12-30 NOTE — ED Triage Notes (Signed)
Pt here after unwitnessed fall.  Family found at 8 am today. Urine all over pants and shirt. Clothes removed, pt cleaned and changed. Pt has dementia at baseline.

## 2018-12-30 NOTE — ED Notes (Addendum)
Pt returned from xray. In and out cath done with scott NT at bedside. No large yellow socks in store room, called for more.  Arm band on, verified fall alarm working, fall mats in place.

## 2018-12-30 NOTE — H&P (Addendum)
[facility] [place] [facility]   PATIENT NAME: Arthur BastDonald [name]    MR#:  [id number]  DATE OF BIRTH:  05/20/1946  DATE OF ADMISSION:  12/30/2018  PRIMARY CARE PHYSICIAN: Crummett, Lysle Rubensaniel D, NP   REQUESTING/REFERRING PHYSICIAN:   CHIEF COMPLAINT:   Chief Complaint  Patient presents with  . Fall    HISTORY OF PRESENT ILLNESS: Arthur [name]  is a 73 y.o. male with a known history of Alzheimer's dementia, diabetes mellitus type 2, hypertension presented to the emergency room after he was found on the floor by family.  Patient was trying to get up and go to the bathroom lost balance and fell down.  Patient is not a great historian secondary to dementia.  Imaging studies in the emergency room showed left hip fracture.  Case was discussed with orthopedic attending and hospitalist service was consulted.  COVID-19 test is negative.  PAST MEDICAL HISTORY:   Past Medical History:  Diagnosis Date  . Alzheimer disease (HCC)   . Diabetes (HCC)   . Hypertension     PAST SURGICAL HISTORY:  Past Surgical History:  Procedure Laterality Date  . KYPHOPLASTY N/A 01/23/2018   Procedure: [id number];  Surgeon: Kennedy BuckerMenz, Michael, MD;  Location: [facility] ORS;  Service: Orthopedics;  Laterality: N/A;    SOCIAL HISTORY:  Social History   Tobacco Use  . Smoking status: Former Smoker  Substance Use Topics  . Alcohol use: Not Currently    FAMILY HISTORY: History reviewed. No pertinent family history.  DRUG ALLERGIES: No Known Allergies  REVIEW OF SYSTEMS:   Could not be obtained secondary to dementia  MEDICATIONS AT HOME:  Prior to Admission medications   Medication Sig Start Date End Date Taking? Authorizing Provider  atorvastatin (LIPITOR) 10 MG tablet Take 10 mg by mouth daily.   Yes [provider]  glimepiride (AMARYL) 2 MG tablet Take 2 mg by mouth daily with breakfast. 08/24/18  Yes [provider]  Insulin Detemir (LEVEMIR FLEXTOUCH) 100 UNIT/ML  Pen Inject 15 Units into the skin every evening. 11/08/18  Yes [provider]  metFORMIN (GLUCOPHAGE) 850 MG tablet Take 850 mg by mouth 2 (two) times daily with a meal.   Yes [provider]  sertraline (ZOLOFT) 50 MG tablet Take 100 mg by mouth daily.   Yes [provider]  vitamin B-12 (CYANOCOBALAMIN) 1000 MCG tablet Take 1,000 mcg by mouth daily.   Yes [provider]  ondansetron (ZOFRAN) 4 MG tablet Take 4 mg by mouth every 8 (eight) hours as needed for nausea or vomiting.    [provider]  traMADol (ULTRAM) 50 MG tablet Take by mouth every 6 (six) hours as needed.    [provider]      PHYSICAL EXAMINATION:   VITAL SIGNS: Blood pressure (!) 137/103, pulse 79, temperature 98.3 F (36.8 C), temperature source Oral, resp. rate 20, height 6\' 1"  (1.854 m), weight 72.6 kg, SpO2 98 %.  GENERAL:  73 y.o.-year-old patient lying in the bed with no acute distress.  EYES: Pupils equal, round, reactive to light and accommodation. No scleral icterus. Extraocular muscles intact.  HEENT: Head atraumatic, normocephalic. Oropharynx and nasopharynx clear.  NECK:  Supple, no jugular venous distention. No thyroid enlargement, no tenderness.  LUNGS: Normal breath sounds bilaterally, no wheezing, rales,rhonchi or crepitation. No use of accessory muscles of respiration.  CARDIOVASCULAR: S1, S2 normal. No murmurs, rubs, or gallops.  ABDOMEN: Soft, nontender, nondistended. Bowel sounds present. No organomegaly or mass.  EXTREMITIES: No pedal edema, cyanosis, or clubbing.  Left hip tenderness noted NEUROLOGIC: Cranial nerves II through XII are intact. Muscle strength 5/5 in all extremities. Sensation intact. Gait not checked.  PSYCHIATRIC: The patient is alert and oriented x 1.  SKIN: No obvious rash, lesion, or ulcer.   LABORATORY PANEL:   CBC Recent Labs  Lab 12/30/18 0923  WBC 20.3*  HGB 12.4*  HCT 36.0*  PLT 279  MCV 88.9  MCH 30.6   MCHC 34.4  RDW 13.2   ------------------------------------------------------------------------------------------------------------------  Chemistries  Recent Labs  Lab 12/30/18 0923  NA 135  K 3.7  CL 99  CO2 22  GLUCOSE 279*  BUN 28*  CREATININE 1.09  CALCIUM 9.2   ------------------------------------------------------------------------------------------------------------------ estimated creatinine clearance is 61.1 mL/min (by C-G formula based on SCr of 1.09 mg/dL). ------------------------------------------------------------------------------------------------------------------ No results for input(s): TSH, T4TOTAL, T3FREE, THYROIDAB in the last 72 hours.  Invalid input(s): FREET3   Coagulation profile Recent Labs  Lab 12/30/18 0923  INR 1.1   ------------------------------------------------------------------------------------------------------------------- No results for input(s): DDIMER in the last 72 hours. -------------------------------------------------------------------------------------------------------------------  Cardiac Enzymes No results for input(s): CKMB, TROPONINI, MYOGLOBIN in the last 168 hours.  Invalid input(s): CK ------------------------------------------------------------------------------------------------------------------ Invalid input(s): POCBNP  ---------------------------------------------------------------------------------------------------------------  Urinalysis    Component Value Date/Time   COLORURINE YELLOW (A) 12/30/2018 1022   APPEARANCEUR HAZY (A) 12/30/2018 1022   APPEARANCEUR Clear 10/27/2012 0429   LABSPEC 1.022 12/30/2018 1022   LABSPEC 1.016 10/27/2012 0429   PHURINE 5.0 12/30/2018 1022   GLUCOSEU >=500 (A) 12/30/2018 1022   GLUCOSEU 150 mg/dL 16/05/9603 5409   HGBUR MODERATE (A) 12/30/2018 1022   BILIRUBINUR NEGATIVE 12/30/2018 1022   BILIRUBINUR Negative 10/27/2012 0429   KETONESUR NEGATIVE 12/30/2018 1022    PROTEINUR 30 (A) 12/30/2018 1022   NITRITE POSITIVE (A) 12/30/2018 1022   LEUKOCYTESUR SMALL (A) 12/30/2018 1022   LEUKOCYTESUR Negative 10/27/2012 0429     RADIOLOGY: Dg Tibia/fibula Left  Result Date: 12/30/2018 CLINICAL DATA:  Left leg pain after fall EXAM: LEFT TIBIA AND FIBULA - 2 VIEW COMPARISON:  None. FINDINGS: Postsurgical changes at the ankle with tibiotalar fusion. No evidence of hardware loosening or fracture. Degenerative changes of the knee, better evaluated on dedicated knee radiographs. No evidence of fracture or dislocation. IMPRESSION: Postsurgical changes at the ankle. Degenerative changes at the knee. No evidence of fracture or dislocation. Electronically Signed   By: Charline Bills M.D.   On: 12/30/2018 10:41   Ct Head Wo Contrast  Result Date: 12/30/2018 CLINICAL DATA:  Unwitnessed fall, dementia EXAM: CT HEAD WITHOUT CONTRAST TECHNIQUE: Contiguous axial images were obtained from the base of the skull through the vertex without intravenous contrast. COMPARISON:  None. FINDINGS: Motion degraded images. Brain: No evidence of acute infarction, hemorrhage, hydrocephalus, extra-axial collection or mass lesion/mass effect. Subcortical white matter and periventricular small vessel ischemic changes. Global cortical and central atrophy. Vascular: Intracranial atherosclerosis. Skull: Normal. Negative for fracture or focal lesion. Sinuses/Orbits: The visualized paranasal sinuses are essentially clear. The mastoid air cells are unopacified. Other: None. IMPRESSION: Motion degraded images. No evidence of acute intracranial abnormality. Atrophy with small vessel ischemic changes. Electronically Signed   By: Charline Bills M.D.   On: 12/30/2018 10:15   Dg Knee Complete 4 Views Left  Result Date: 12/30/2018 CLINICAL DATA:  Left leg pain after fall EXAM: LEFT KNEE - COMPLETE 4+ VIEW COMPARISON:  None. FINDINGS: No fracture or dislocation is seen. Moderate tricompartmental degenerative  changes, most prominent in the medial compartment. Chondrocalcinosis in  the medial and lateral compartments. Visualized soft tissues are within normal limits. No definite suprapatellar knee joint effusion. IMPRESSION: No fracture or dislocation is seen. 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 Electronically Signed   By: Charline Bills M.D.   On: 12/30/2018 10:40   Dg Foot 2 Views Left  Result Date: 12/30/2018 CLINICAL DATA:  Left leg pain after fall EXAM: LEFT FOOT - 2 VIEW COMPARISON:  None. FINDINGS: Status post tibiotalar fusion. No evidence of hardware loosening or fracture. No evidence of fracture or dislocation. The joint spaces are preserved. The visualized soft tissues are unremarkable. IMPRESSION: Status post tibiotalar fusion.  No evidence of complication. No fracture or dislocation is seen. Electronically Signed   By: Charline Bills M.D.   On: 12/30/2018 10:41   Dg Hip Unilat W Or Wo Pelvis 2-3 Views Left  Result Date: 12/30/2018 CLINICAL DATA:  Left leg pain after fall EXAM: DG HIP (WITH OR WITHOUT PELVIS) 2-3V LEFT COMPARISON:  None. FINDINGS: Nondisplaced intertrochanteric left hip fracture. Bilateral hip joint spaces are preserved. Visualized bony pelvis appears intact. IMPRESSION: Nondisplaced intertrochanteric left hip fracture. Electronically Signed   By: Charline Bills M.D.   On: 12/30/2018 10:39    EKG: Orders placed or performed during the hospital encounter of 12/30/18  . ED EKG  . ED EKG  . EKG 12-Lead  . EKG 12-Lead    IMPRESSION AND PLAN:  73 year old male patient with history of type 2 diabetes mellitus, hypertension, Alzheimer's dementia presented to the emergency room for fall  -Left hip fracture Orthopedic surgery consult N.p.o. after midnight  -UTI IV Rocephin antibiotic F/U cultures  -Dehydration IV fluids  -Left hip pain Pain management with oral tramadol and PRN morphine  -Alzheimer's dementia Supportive  care One-on-one observation for patient safety  -Type 2 diabetes mellitus Diabetic diet Sliding scale coverage with insulin  -DVT prophylaxis sequential compression device to lower extremities  All the records are reviewed and case discussed with ED provider. Management plans discussed with the patient, family and they are in agreement.  CODE STATUS:Full code  TOTAL TIME TAKING CARE OF THIS PATIENT: 53 minutes.    Ihor Austin M.D on 12/30/2018 at 1:50 PM  Between 7am to 6pm - Pager - [phone]  After 6pm go to [email or web address] - password [email or web address] [facility]  [facility]  Office  [phone]  CC: Primary care physician; Crummett, Lysle Rubens, NP

## 2018-12-30 NOTE — ED Notes (Signed)
Called and updated daughter.  She reports pt lives alone and family lives close but he does not live with family. Dr Fanny Bien notified.

## 2018-12-30 NOTE — ED Notes (Signed)
Called to verify if any questions before the pt goes up with the sitter.

## 2018-12-30 NOTE — ED Notes (Signed)
Pt had unwitnessed fall. Unknown downtime. Grimace on palpation of left hip. Feet cool to touch bilateral.

## 2018-12-30 NOTE — ED Provider Notes (Signed)
[facility] Emergency Department Provider Note   ____________________________________________   First MD Initiated Contact with Patient 12/30/18 [phone]     (approximate)  I have reviewed the triage vital signs and the nursing notes.   HISTORY  Chief Complaint Fall  EM caveat: Limited by dementia and patient's inability to recall events  HPI Arthur [name] is a 73 y.o. male   has a history of Alzheimer's, diabetes, lives at home with his family.  EMS was called to the residence after family found him on the floor.  They think he got up to go the bathroom last night he was found down about 8 in the morning.  Is been noticing that he has a fair amount of pain seem to be coming somewhere from around the left lower leg, family reports he has a history of previous left knee or ankle surgery.  EMS noted the patient seems to be in pain when moving the left leg.  Patient reports that he "hopes it is okay", but cannot specify further.  Cannot give a reasonable history and seems confused.  He just denies headache chest pain trouble breathing, but does report something hurts, cannot really describe well where but he uses upper extremities well without pain in his right lower extremity able to range any moves it without pain.  He reports hurts when he moves the left lower leg  Past Medical History:  Diagnosis Date   Alzheimer disease (HCC)    Diabetes (HCC)    Hypertension     Patient Active Problem List   Diagnosis Date Noted   Hip fracture (HCC) 12/30/2018    Past Surgical History:  Procedure Laterality Date   KYPHOPLASTY N/A 01/23/2018   Procedure: Cassell Clement;  Surgeon: Kennedy Bucker, MD;  Location: [facility] ORS;  Service: Orthopedics;  Laterality: N/A;    Prior to Admission medications   Medication Sig Start Date End Date Taking? Authorizing Provider  atorvastatin (LIPITOR) 10 MG tablet Take 10 mg by mouth daily.   Yes [provider]    glimepiride (AMARYL) 2 MG tablet Take 2 mg by mouth daily with breakfast. 08/24/18  Yes [provider]  Insulin Detemir (LEVEMIR FLEXTOUCH) 100 UNIT/ML Pen Inject 15 Units into the skin every evening. 11/08/18  Yes [provider]  metFORMIN (GLUCOPHAGE) 850 MG tablet Take 850 mg by mouth 2 (two) times daily with a meal.   Yes [provider]  sertraline (ZOLOFT) 50 MG tablet Take 100 mg by mouth daily.   Yes [provider]  vitamin B-12 (CYANOCOBALAMIN) 1000 MCG tablet Take 1,000 mcg by mouth daily.   Yes [provider]  ondansetron (ZOFRAN) 4 MG tablet Take 4 mg by mouth every 8 (eight) hours as needed for nausea or vomiting.    [provider]  traMADol (ULTRAM) 50 MG tablet Take by mouth every 6 (six) hours as needed.    [provider]    Allergies Patient has no known allergies.  History reviewed. No pertinent family history.  Social History Social History   Tobacco Use   Smoking status: Former Smoker  Substance Use Topics   Alcohol use: Not Currently   Drug use: Never    Review of Systems   EM caveat   ____________________________________________   PHYSICAL EXAM:  VITAL SIGNS: ED Triage Vitals [12/30/18 0919]  Enc Vitals Group     BP      Pulse      Resp  Temp      Temp src      SpO2      Weight 160 lb (72.6 kg)     Height  (1.854 m)     Head Circumference      Peak Flow      Pain Score      Pain Loc      Pain Edu?      Excl. in GC?     Constitutional: Alert and oriented to self but not situation or date.  Eyes: Conjunctivae are normal. Head: Atraumatic. Nose: No congestion/rhinnorhea. Mouth/Throat: Mucous membranes are moist. Neck: No stridor.  Cardiovascular: Normal rate, regular rhythm. Grossly normal heart sounds.  Good peripheral circulation. Respiratory: Normal respiratory effort.  No retractions. Lungs CTAB. Gastrointestinal: Soft and nontender. No  distention. Musculoskeletal:   RIGHT Right upper extremity demonstrates normal strength, good use of all muscles. No edema bruising or contusions of the right shoulder/upper arm, right elbow, right forearm / hand. Full range of motion of the right right upper extremity without pain. No evidence of trauma. Strong radial pulse. Intact median/ulnar/radial neuro-muscular exam.  LEFT Left upper extremity demonstrates normal strength, good use of all muscles. No edema bruising or contusions of the left shoulder/upper arm, left elbow, left forearm / hand. Full range of motion of the left  upper extremity without pain. No evidence of trauma. Strong radial pulse. Intact median/ulnar/radial neuro-muscular exam.  Lower Extremities  No edema. Normal DP/PT pulses bilateral with good cap refill.  Normal neuro-motor function lower extremities bilateral.  RIGHT Right lower extremity demonstrates normal strength, good use of all muscles. No edema bruising or contusions of the right hip, right knee, right ankle. Full range of motion of the right lower extremity without pain. No pain on axial loading. No evidence of trauma.  LEFT Left lower extremity demonstrates some shortening and external rotation.  Tender especially over the hip left trochanter.  Femur straight.  Left knee seems to have some chronic deformity but nothing appears acute.  Left ankle old incision.  Palpable dorsalis pedis with normal capillary refill of the digits left foot.   Neurologic:  Normal speech and language. No gross focal neurologic deficits are appreciated.  Skin:  Skin is warm, dry and intact. No rash noted. Psychiatric: Mood and affect are normal. Speech and behavior are normal.  ____________________________________________   LABS (all labs ordered are listed, but only abnormal results are displayed)  Labs Reviewed  CBC - Abnormal; Notable for the following components:      Result Value   WBC 20.3 (*)    RBC 4.05 (*)     Hemoglobin 12.4 (*)    HCT 36.0 (*)    All other components within normal limits  BASIC METABOLIC PANEL - Abnormal; Notable for the following components:   Glucose, Bld 279 (*)    BUN 28 (*)    All other components within normal limits  URINALYSIS, COMPLETE (UACMP) WITH MICROSCOPIC - Abnormal; Notable for the following components:   Color, Urine YELLOW (*)    APPearance HAZY (*)    Glucose, UA >=500 (*)    Hgb urine dipstick MODERATE (*)    Protein, ur 30 (*)    Nitrite POSITIVE (*)    Leukocytes,Ua SMALL (*)    All other components within normal limits  URINE CULTURE  SARS CORONAVIRUS 2 (HOSPITAL ORDER, PERFORMED IN [facility])  CK  PROTIME-INR  SAMPLE TO BLOOD BANK   ____________________________________________  EKG  Reviewed enterotomy at 9:30 AM Heart rate 79 QRS 99 QTc 450 Normal sinus rhythm no evidence of ischemia or ectopy ____________________________________________  RADIOLOGY  Dg Tibia/fibula Left  Result Date: 12/30/2018 CLINICAL DATA:  Left leg pain after fall EXAM: LEFT TIBIA AND FIBULA - 2 VIEW COMPARISON:  None. FINDINGS: Postsurgical changes at the ankle with tibiotalar fusion. No evidence of hardware loosening or fracture. Degenerative changes of the knee, better evaluated on dedicated knee radiographs. No evidence of fracture or dislocation. IMPRESSION: Postsurgical changes at the ankle. Degenerative changes at the knee. No evidence of fracture or dislocation. Electronically Signed   By: Charline BillsSriyesh  Krishnan M.D.   On: 12/30/2018 10:41   Ct Head Wo Contrast  Result Date: 12/30/2018 CLINICAL DATA:  Unwitnessed fall, dementia EXAM: CT HEAD WITHOUT CONTRAST TECHNIQUE: Contiguous axial images were obtained from the base of the skull through the vertex without intravenous contrast. COMPARISON:  None. FINDINGS: Motion degraded images. Brain: No evidence of acute infarction, hemorrhage, hydrocephalus, extra-axial collection or mass lesion/mass effect.  Subcortical white matter and periventricular small vessel ischemic changes. Global cortical and central atrophy. Vascular: Intracranial atherosclerosis. Skull: Normal. Negative for fracture or focal lesion. Sinuses/Orbits: The visualized paranasal sinuses are essentially clear. The mastoid air cells are unopacified. Other: None. IMPRESSION: Motion degraded images. No evidence of acute intracranial abnormality. Atrophy with small vessel ischemic changes. Electronically Signed   By: Charline BillsSriyesh  Krishnan M.D.   On: 12/30/2018 10:15   Dg Knee Complete 4 Views Left  Result Date: 12/30/2018 CLINICAL DATA:  Left leg pain after fall EXAM: LEFT KNEE - COMPLETE 4+ VIEW COMPARISON:  None. FINDINGS: No fracture or dislocation is seen. Moderate tricompartmental degenerative changes, most prominent in the medial compartment. Chondrocalcinosis in the medial and lateral compartments. Visualized soft tissues are within normal limits. No definite suprapatellar knee joint effusion. IMPRESSION: No fracture or dislocation is seen. 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 Electronically Signed   By: Charline BillsSriyesh  Krishnan M.D.   On: 12/30/2018 10:40   Dg Foot 2 Views Left  Result Date: 12/30/2018 CLINICAL DATA:  Left leg pain after fall EXAM: LEFT FOOT - 2 VIEW COMPARISON:  None. FINDINGS: Status post tibiotalar fusion. No evidence of hardware loosening or fracture. No evidence of fracture or dislocation. The joint spaces are preserved. The visualized soft tissues are unremarkable. IMPRESSION: Status post tibiotalar fusion.  No evidence of complication. No fracture or dislocation is seen. Electronically Signed   By: Charline BillsSriyesh  Krishnan M.D.   On: 12/30/2018 10:41   Dg Hip Unilat W Or Wo Pelvis 2-3 Views Left  Result Date: 12/30/2018 CLINICAL DATA:  Left leg pain after fall EXAM: DG HIP (WITH OR WITHOUT PELVIS) 2-3V LEFT COMPARISON:  None. FINDINGS: Nondisplaced intertrochanteric left hip fracture.  Bilateral hip joint spaces are preserved. Visualized bony pelvis appears intact. IMPRESSION: Nondisplaced intertrochanteric left hip fracture. Electronically Signed   By: Charline BillsSriyesh  Krishnan M.D.   On: 12/30/2018 10:39    Imaging results reviewed, most notable for left intertrochanteric hip fracture ____________________________________________   PROCEDURES  Procedure(s) performed: None  Procedures  Critical Care performed: No  ____________________________________________   INITIAL IMPRESSION / ASSESSMENT AND PLAN / ED COURSE  Pertinent labs & imaging results that were available during my care of the patient were reviewed by me and considered in my medical decision making (see chart for details).   Patient presents after a fall.  Unwitnessed.  CT negative for acute.  X-ray demonstrating left intratrochanteric sugar.  Also leukocytosis with leukocyte esterase, nitrites,  and white cells in urine.  Query possible urinary tract infection.  Discussed case and care with Dr. Rosita Kea of orthopedics, he will see in consult on the patient advised we will be able to do surgery today.  Discussed with the hospitalist, Dr. Tobi Bastos seeing and evaluating for further work-up.    ----------------------------------------- 12:45 PM on 12/30/2018 -----------------------------------------  Patient remains calm.  Pleasant.  In no acute distress.  ____________________________________________   FINAL CLINICAL IMPRESSION(S) / ED DIAGNOSES  Final diagnoses:  Urinary tract infection, acute  Closed left hip fracture, initial encounter [facility])        Note:  This document was prepared using Dragon voice recognition software and may include unintentional dictation errors       Sharyn Creamer, MD 12/30/18 1245

## 2018-12-30 NOTE — ED Notes (Signed)
Patient transported to CT 

## 2018-12-30 NOTE — ED Notes (Signed)
Xray called to inform RN pt vomited while in xray.  Dr Fanny Bien notified.

## 2018-12-30 NOTE — Progress Notes (Signed)
Dr Anne Hahn notified of patients agitation and BG of 295. MD to place new orders.

## 2018-12-30 NOTE — ED Notes (Signed)
Posey fall alarm placed under pt

## 2018-12-30 NOTE — ED Notes (Signed)
Pt swabbed for covid.

## 2018-12-30 NOTE — ED Notes (Signed)
Nursing supervisor is working on Personal assistant for the floor. They will call when that is set up.

## 2018-12-31 ENCOUNTER — Encounter
Admission: EM | Disposition: A | Discharge status: Skilled Nursing Facility | Payer: Self-pay | Source: Home / Self Care | Attending: Internal Medicine

## 2018-12-31 ENCOUNTER — Inpatient Hospital Stay: Payer: Medicare Other | Admitting: Certified Registered"

## 2018-12-31 ENCOUNTER — Inpatient Hospital Stay: Payer: Medicare Other

## 2018-12-31 HISTORY — PX: HIP PINNING,CANNULATED: SHX1758

## 2018-12-31 LAB — BASIC METABOLIC PANEL
Anion gap: 14 (ref 5–15)
BUN: 29 mg/dL — ABNORMAL HIGH (ref 8–23)
CO2: 22 mmol/L (ref 22–32)
Calcium: 8.9 mg/dL (ref 8.9–10.3)
Chloride: 99 mmol/L (ref 98–111)
Creatinine, Ser: 1.7 mg/dL — ABNORMAL HIGH (ref 0.61–1.24)
GFR calc Af Amer: 45 mL/min — ABNORMAL LOW (ref >60)
GFR calc non Af Amer: 39 mL/min — ABNORMAL LOW (ref >60)
Glucose, Bld: 399 mg/dL — ABNORMAL HIGH (ref 70–99)
Potassium: 4.2 mmol/L (ref 3.5–5.1)
Sodium: 135 mmol/L (ref 135–145)

## 2018-12-31 LAB — GLUCOSE, CAPILLARY
Glucose-Capillary: 365 mg/dL — ABNORMAL HIGH (ref 70–99)
Glucose-Capillary: 365 mg/dL — ABNORMAL HIGH (ref 70–99)
Glucose-Capillary: 257 mg/dL — ABNORMAL HIGH (ref 70–99)
Glucose-Capillary: 239 mg/dL — ABNORMAL HIGH (ref 70–99)
Glucose-Capillary: 212 mg/dL — ABNORMAL HIGH (ref 70–99)
Glucose-Capillary: 102 mg/dL — ABNORMAL HIGH (ref 70–99)
Glucose-Capillary: 204 mg/dL — ABNORMAL HIGH (ref 70–99)

## 2018-12-31 LAB — CBC
HCT: 35.6 % — ABNORMAL LOW (ref 39.0–52.0)
Hemoglobin: 12.2 g/dL — ABNORMAL LOW (ref 13.0–17.0)
MCH: 30.7 pg (ref 26.0–34.0)
MCHC: 34.3 g/dL (ref 30.0–36.0)
MCV: 89.4 fL (ref 80.0–100.0)
Platelets: 270 10*3/uL (ref 150–400)
RBC: 3.98 MIL/uL — ABNORMAL LOW (ref 4.22–5.81)
RDW: 13.2 % (ref 11.5–15.5)
WBC: 19.1 10*3/uL — ABNORMAL HIGH (ref 4.0–10.5)
nRBC: 0 % (ref 0.0–0.2)

## 2018-12-31 SURGERY — FIXATION, FEMUR, NECK, PERCUTANEOUS, USING SCREW
Anesthesia: Spinal | Laterality: Left

## 2018-12-31 MED ORDER — PROPOFOL 500 MG/50ML IV EMUL
INTRAVENOUS | Status: DC | PRN
  Administered 2018-12-31: 25 ug/kg/min via INTRAVENOUS

## 2018-12-31 MED ORDER — PROCHLORPERAZINE EDISYLATE 10 MG/2ML IJ SOLN
10.0000 mg | Freq: Four times a day (QID) | INTRAMUSCULAR | Status: DC | PRN
  Administered 2018-12-31: 04:00:00 10 mg via INTRAVENOUS
  Filled 2018-12-31 (×2): qty 2

## 2018-12-31 MED ORDER — SODIUM CHLORIDE 0.9 % IV SOLN
INTRAVENOUS | Status: DC | PRN
  Administered 2018-12-31: 3 ug/min via INTRAVENOUS

## 2018-12-31 MED ORDER — ACETAMINOPHEN 500 MG PO TABS
500.0000 mg | ORAL_TABLET | Freq: Four times a day (QID) | ORAL | Status: AC
  Administered 2018-12-31 – 2019-01-01 (×2): 500 mg via ORAL
  Filled 2018-12-31 (×3): qty 1

## 2018-12-31 MED ORDER — INSULIN ASPART 100 UNIT/ML ~~LOC~~ SOLN
SUBCUTANEOUS | Status: AC
  Filled 2018-12-31: qty 1

## 2018-12-31 MED ORDER — CEFAZOLIN SODIUM-DEXTROSE 2-4 GM/100ML-% IV SOLN
2.0000 g | Freq: Four times a day (QID) | INTRAVENOUS | Status: AC
  Administered 2018-12-31 – 2019-01-01 (×3): 2 g via INTRAVENOUS
  Filled 2018-12-31 (×3): qty 100

## 2018-12-31 MED ORDER — PHENOL 1.4 % MT LIQD
1.0000 | OROMUCOSAL | Status: DC | PRN
  Filled 2018-12-31: qty 177

## 2018-12-31 MED ORDER — PROPOFOL 500 MG/50ML IV EMUL
INTRAVENOUS | Status: AC
  Filled 2018-12-31: qty 50

## 2018-12-31 MED ORDER — MAGNESIUM HYDROXIDE 400 MG/5ML PO SUSP
30.0000 mL | Freq: Every day | ORAL | Status: DC | PRN
  Administered 2019-01-01: 30 mL via ORAL
  Filled 2018-12-31 (×2): qty 30

## 2018-12-31 MED ORDER — METHOCARBAMOL 500 MG PO TABS
500.0000 mg | ORAL_TABLET | Freq: Four times a day (QID) | ORAL | Status: DC | PRN
  Administered 2019-01-01: 500 mg via ORAL
  Filled 2018-12-31 (×2): qty 1

## 2018-12-31 MED ORDER — FENTANYL CITRATE (PF) 100 MCG/2ML IJ SOLN
INTRAMUSCULAR | Status: AC
  Filled 2018-12-31: qty 2

## 2018-12-31 MED ORDER — BUPIVACAINE HCL (PF) 0.5 % IJ SOLN
INTRAMUSCULAR | Status: DC | PRN
  Administered 2018-12-31: 3 mL

## 2018-12-31 MED ORDER — INSULIN ASPART 100 UNIT/ML ~~LOC~~ SOLN
10.0000 [IU] | Freq: Once | SUBCUTANEOUS | Status: AC
  Administered 2018-12-31: 10 [IU] via SUBCUTANEOUS

## 2018-12-31 MED ORDER — METOCLOPRAMIDE HCL 5 MG/ML IJ SOLN: 5.0000 mg | Freq: Three times a day (TID) | INTRAMUSCULAR | Status: DC | PRN

## 2018-12-31 MED ORDER — ENOXAPARIN SODIUM 40 MG/0.4ML ~~LOC~~ SOLN
40.0000 mg | SUBCUTANEOUS | Status: DC
  Administered 2019-01-01: 40 mg via SUBCUTANEOUS
  Filled 2018-12-31: qty 0.4

## 2018-12-31 MED ORDER — MAGNESIUM CITRATE PO SOLN
1.0000 | Freq: Once | ORAL | Status: DC | PRN
  Filled 2018-12-31: qty 296

## 2018-12-31 MED ORDER — HYDROCODONE-ACETAMINOPHEN 5-325 MG PO TABS
1.0000 | ORAL_TABLET | ORAL | Status: DC | PRN
  Administered 2019-01-01 – 2019-01-04 (×6): 1 via ORAL
  Filled 2018-12-31 (×6): qty 1

## 2018-12-31 MED ORDER — SODIUM CHLORIDE 0.9 % IV SOLN
Freq: Once | INTRAVENOUS | Status: AC
  Administered 2018-12-31: 10:00:00 via INTRAVENOUS

## 2018-12-31 MED ORDER — NEOMYCIN-POLYMYXIN B GU 40-200000 IR SOLN
Status: AC
  Filled 2018-12-31: qty 20

## 2018-12-31 MED ORDER — METHOCARBAMOL 1000 MG/10ML IJ SOLN
500.0000 mg | Freq: Four times a day (QID) | INTRAVENOUS | Status: DC | PRN
  Filled 2018-12-31: qty 5

## 2018-12-31 MED ORDER — ALUM & MAG HYDROXIDE-SIMETH 200-200-20 MG/5ML PO SUSP: 30.0000 mL | ORAL | Status: DC | PRN

## 2018-12-31 MED ORDER — SEVOFLURANE IN SOLN
RESPIRATORY_TRACT | Status: AC
  Filled 2018-12-31: qty 250

## 2018-12-31 MED ORDER — SODIUM CHLORIDE 0.9 % IV SOLN
INTRAVENOUS | Status: DC
  Administered 2018-12-31 (×2): via INTRAVENOUS

## 2018-12-31 MED ORDER — PROPOFOL 10 MG/ML IV BOLUS
INTRAVENOUS | Status: DC | PRN
  Administered 2018-12-31: 5 mg via INTRAVENOUS
  Administered 2018-12-31: 10 mg via INTRAVENOUS

## 2018-12-31 MED ORDER — INSULIN ASPART 100 UNIT/ML ~~LOC~~ SOLN
6.0000 [IU] | Freq: Once | SUBCUTANEOUS | Status: AC
  Administered 2018-12-31: 6 [IU] via SUBCUTANEOUS
  Filled 2018-12-31: qty 1

## 2018-12-31 MED ORDER — BISACODYL 10 MG RE SUPP
10.0000 mg | Freq: Every day | RECTAL | Status: DC | PRN
  Administered 2019-01-02: 19:00:00 10 mg via RECTAL
  Filled 2018-12-31: qty 1

## 2018-12-31 MED ORDER — ENOXAPARIN SODIUM 30 MG/0.3ML ~~LOC~~ SOLN: 30.0000 mg | SUBCUTANEOUS | Status: DC

## 2018-12-31 MED ORDER — FENTANYL CITRATE (PF) 100 MCG/2ML IJ SOLN: 25.0000 ug | INTRAMUSCULAR | Status: DC | PRN

## 2018-12-31 MED ORDER — DOCUSATE SODIUM 100 MG PO CAPS
100.0000 mg | ORAL_CAPSULE | Freq: Two times a day (BID) | ORAL | Status: DC
  Administered 2018-12-31 – 2019-01-04 (×8): 100 mg via ORAL
  Filled 2018-12-31 (×8): qty 1

## 2018-12-31 MED ORDER — PHENYLEPHRINE HCL (PRESSORS) 10 MG/ML IV SOLN
INTRAVENOUS | Status: DC | PRN
  Administered 2018-12-31 (×6): 100 ug via INTRAVENOUS
  Administered 2018-12-31: 200 ug via INTRAVENOUS

## 2018-12-31 MED ORDER — MENTHOL 3 MG MT LOZG
1.0000 | LOZENGE | OROMUCOSAL | Status: DC | PRN
  Filled 2018-12-31: qty 9

## 2018-12-31 MED ORDER — KETAMINE HCL 50 MG/ML IJ SOLN
INTRAMUSCULAR | Status: AC
  Filled 2018-12-31: qty 10

## 2018-12-31 MED ORDER — KETAMINE HCL 10 MG/ML IJ SOLN
INTRAMUSCULAR | Status: DC | PRN
  Administered 2018-12-31 (×2): 25 mg via INTRAVENOUS

## 2018-12-31 MED ORDER — METOCLOPRAMIDE HCL 5 MG PO TABS
5.0000 mg | ORAL_TABLET | Freq: Three times a day (TID) | ORAL | Status: DC | PRN
  Filled 2018-12-31: qty 2

## 2018-12-31 SURGICAL SUPPLY — 34 items
BIT DRILL 4.3MMS DISTAL GRDTED (BIT) ×1 IMPLANT
CANISTER SUCT 1200ML W/VALVE (MISCELLANEOUS) ×3 IMPLANT
CHLORAPREP W/TINT 26 (MISCELLANEOUS) ×3 IMPLANT
CORTICAL BONE SCR 5.0MM X 56MM (Screw) ×3 IMPLANT
COVER WAND RF STERILE (DRAPES) ×3 IMPLANT
DRAPE SHEET LG 3/4 BI-LAMINATE (DRAPES) ×3 IMPLANT
DRAPE U-SHAPE 47X51 STRL (DRAPES) ×3 IMPLANT
DRILL 4.3MMS DISTAL GRADUATED (BIT) ×3
DRSG OPSITE POSTOP 3X4 (GAUZE/BANDAGES/DRESSINGS) ×9 IMPLANT
GLOVE BIOGEL PI IND STRL 9 (GLOVE) ×1 IMPLANT
GLOVE BIOGEL PI INDICATOR 9 (GLOVE) ×2
GLOVE SURG SYN 9.0  PF PI (GLOVE) ×2
GLOVE SURG SYN 9.0 PF PI (GLOVE) ×1 IMPLANT
GOWN SRG 2XL LVL 4 RGLN SLV (GOWNS) ×1 IMPLANT
GOWN STRL NON-REIN 2XL LVL4 (GOWNS) ×2
GOWN STRL REUS W/ TWL LRG LVL3 (GOWN DISPOSABLE) ×1 IMPLANT
GOWN STRL REUS W/TWL LRG LVL3 (GOWN DISPOSABLE) ×2
GUIDEPIN VERSANAIL DSP 3.2X444 (ORTHOPEDIC DISPOSABLE SUPPLIES) ×3 IMPLANT
GUIDEWIRE BALL NOSE 100CM (WIRE) ×3 IMPLANT
HIP FR NAIL LAG SCREW 10.5X110 (Orthopedic Implant) ×3 IMPLANT
KIT TURNOVER KIT A (KITS) ×3 IMPLANT
MAT ABSORB  FLUID 56X50 GRAY (MISCELLANEOUS) ×2
MAT ABSORB FLUID 56X50 GRAY (MISCELLANEOUS) ×1 IMPLANT
NAIL HIP FX 11MMX42CM (Nail) ×3 IMPLANT
NEEDLE FILTER BLUNT 18X 1/2SAF (NEEDLE) ×2
NEEDLE FILTER BLUNT 18X1 1/2 (NEEDLE) ×1 IMPLANT
NS IRRIG 500ML POUR BTL (IV SOLUTION) ×3 IMPLANT
PACK HIP COMPR (MISCELLANEOUS) ×3 IMPLANT
SCALPEL PROTECTED #15 DISP (BLADE) ×6 IMPLANT
SCREW LAG HIP FR NAIL 10.5X110 (Orthopedic Implant) ×1 IMPLANT
STAPLER SKIN PROX 35W (STAPLE) ×3 IMPLANT
SUT VIC AB 1 CT1 36 (SUTURE) ×3 IMPLANT
SUT VIC AB 2-0 CT1 (SUTURE) ×3 IMPLANT
SYR 10ML LL (SYRINGE) ×3 IMPLANT

## 2018-12-31 NOTE — Anesthesia Post-op Follow-up Note (Signed)
Anesthesia QCDR form completed.        

## 2018-12-31 NOTE — Anesthesia Preprocedure Evaluation (Addendum)
Anesthesia Evaluation  Patient identified by MRN, date of birth, ID band Patient awake    Reviewed: Allergy & Precautions, H&P , NPO status , Patient's Chart, lab work & pertinent test results  Airway   TM Distance: >3 FB    Comment: Pt unable to comply with Mallampati assessment Dental   Pulmonary former smoker,    breath sounds clear to auscultation       Cardiovascular hypertension,  Rhythm:regular Rate:Tachycardia - Systolic murmurs Pt with dementia, poor historian, unable to provide circumstances surrounding fall EKG no significant change from prior No echo in Epic   Neuro/Psych PSYCHIATRIC DISORDERS Dementia CT head negative    GI/Hepatic negative GI ROS, Neg liver ROS,   Endo/Other  diabetes, Poorly Controlled  Renal/GU      Musculoskeletal   Abdominal   Peds  Hematology negative hematology ROS (+)   Anesthesia Other Findings Past Medical History: No date: Alzheimer disease (HCC) No date: Diabetes (HCC) No date: Hypertension  Past Surgical History: 01/23/2018: KYPHOPLASTY; N/A     Comment:  Procedure: KYPHOPLASTY-L1;  Surgeon: Kennedy Bucker, MD;               Location: [facility] ORS;  Service: Orthopedics;  Laterality:               N/A;  BMI    Body Mass Index:  21.11 kg/m      Reproductive/Obstetrics negative OB ROS                           Anesthesia Physical Anesthesia Plan  ASA: III  Anesthesia Plan: Spinal   Post-op Pain Management:    Induction:   PONV Risk Score and Plan: Propofol infusion  Airway Management Planned: Natural Airway and Simple Face Mask  Additional Equipment:   Intra-op Plan:   Post-operative Plan:   Informed Consent: I have reviewed the patients History and Physical, chart, labs and discussed the procedure including the risks, benefits and alternatives for the proposed anesthesia with the patient or authorized representative who has  indicated his/her understanding and acceptance.     Dental Advisory Given  Plan Discussed with: Anesthesiologist and CRNA  Anesthesia Plan Comments:        Anesthesia Quick Evaluation

## 2018-12-31 NOTE — Anesthesia Procedure Notes (Signed)
Spinal  Patient location during procedure: OR Start time: 12/31/2018 8:12 AM End time: 12/31/2018 8:18 AM Staffing Anesthesiologist: Durenda Hurt, MD Resident/CRNA: Allean Found, CRNA Preanesthetic Checklist Completed: patient identified, site marked, surgical consent, pre-op evaluation, timeout performed, IV checked, risks and benefits discussed and monitors and equipment checked Spinal Block Patient position: sitting Prep: Betadine Patient monitoring: heart rate, continuous pulse ox, blood pressure and cardiac monitor Approach: midline Location: L3-4 Injection technique: single-shot Needle Needle type: Whitacre, Introducer and Spinocan  Needle gauge: 24 G Needle length: 9 cm Needle insertion depth: 7.5 cm Assessment Sensory level: T6 Additional Notes Negative paresthesia. Negative blood return. Positive free-flowing CSF. Expiration date of kit checked and confirmed. Patient tolerated procedure well, without complications.

## 2018-12-31 NOTE — Progress Notes (Signed)
Pt with glucose of 399 this morning. Finger stick of 365. Spoke with Dr. Sheryle Hail MD to place orders.

## 2018-12-31 NOTE — Plan of Care (Signed)
  Problem: Education: Goal: Verbalization of understanding the information provided (i.e., activity precautions, restrictions, etc) will improve Outcome: Progressing Goal: Individualized Educational Video(s) Outcome: Progressing   

## 2018-12-31 NOTE — Progress Notes (Signed)
Patient resting in bed. Sitter at bedside. Dressings on left hip clean dry and intact. No signs of distress. Call bell in reach, lowbed in lowest position, bed alarm on. Continue to monitor.

## 2018-12-31 NOTE — Plan of Care (Signed)
Patient is restless and agitated pulling at his line earlier in shift. Patient is currently 1:1 for safety. Patient has vomited twice this shift so far.    Problem: Education: Goal: Verbalization of understanding the information provided (i.e., activity precautions, restrictions, etc) will improve Outcome: Progressing Goal: Individualized Educational Video(s) Outcome: Progressing   Problem: Activity: Goal: Ability to ambulate and perform ADLs will improve Outcome: Progressing   Problem: Clinical Measurements: Goal: Postoperative complications will be avoided or minimized Outcome: Progressing   Problem: Self-Concept: Goal: Ability to maintain and perform role responsibilities to the fullest extent possible will improve Outcome: Progressing   Problem: Pain Management: Goal: Pain level will decrease Outcome: Progressing   Problem: Education: Goal: Knowledge of General Education information will improve Description Including pain rating scale, medication(s)/side effects and non-pharmacologic comfort measures Outcome: Progressing   Problem: Health Behavior/Discharge Planning: Goal: Ability to manage health-related needs will improve Outcome: Progressing   Problem: Clinical Measurements: Goal: Ability to maintain clinical measurements within normal limits will improve Outcome: Progressing Goal: Will remain free from infection Outcome: Progressing Goal: Diagnostic test results will improve Outcome: Progressing Goal: Respiratory complications will improve Outcome: Progressing Goal: Cardiovascular complication will be avoided Outcome: Progressing   Problem: Activity: Goal: Risk for activity intolerance will decrease Outcome: Progressing   Problem: Nutrition: Goal: Adequate nutrition will be maintained Outcome: Progressing   Problem: Coping: Goal: Level of anxiety will decrease Outcome: Progressing   Problem: Elimination: Goal: Will not experience complications  related to bowel motility Outcome: Progressing Goal: Will not experience complications related to urinary retention Outcome: Progressing   Problem: Pain Managment: Goal: General experience of comfort will improve Outcome: Progressing   Problem: Safety: Goal: Ability to remain free from injury will improve Outcome: Progressing   Problem: Skin Integrity: Goal: Risk for impaired skin integrity will decrease Outcome: Progressing

## 2018-12-31 NOTE — Anesthesia Procedure Notes (Signed)
Date/Time: 12/31/2018 8:15 AM Performed by: Henrietta Hoover, CRNA Pre-anesthesia Checklist: Patient identified, Emergency Drugs available, Suction available, Patient being monitored and Timeout performed Oxygen Delivery Method: Nasal cannula Placement Confirmation: positive ETCO2

## 2018-12-31 NOTE — Progress Notes (Signed)
[employer] - [place] at [facility]   PATIENT NAME: Arthur [name]    MR#:  [id number]  DATE OF BIRTH:  02/21/1946  SUBJECTIVE:  CHIEF COMPLAINT:   Chief Complaint  Patient presents with  . Fall  Patient seen and evaluated today Status post orthopedic surgery Tolerated procedure well Tolerating pain okay  REVIEW OF SYSTEMS:    ROS  Demented  DRUG ALLERGIES:  No Known Allergies  VITALS:  Blood pressure 116/76, pulse (!) 101, temperature 98.5 F (36.9 C), temperature source Oral, resp. rate 15, height 6\' 1"  (1.854 m), weight 72.6 kg, SpO2 98 %.  PHYSICAL EXAMINATION:   Physical Exam  GENERAL:  73 y.o.-year-old patient lying in the bed with no acute distress.  EYES: Pupils equal, round, reactive to light and accommodation. No scleral icterus. Extraocular muscles intact.  HEENT: Head atraumatic, normocephalic. Oropharynx and nasopharynx clear.  NECK:  Supple, no jugular venous distention. No thyroid enlargement, no tenderness.  LUNGS: Normal breath sounds bilaterally, no wheezing, rales, rhonchi. No use of accessory muscles of respiration.  CARDIOVASCULAR: S1, S2 normal. No murmurs, rubs, or gallops.  ABDOMEN: Soft, nontender, nondistended. Bowel sounds present. No organomegaly or mass.  EXTREMITIES: No cyanosis, clubbing or edema b/l.    Bandaged left hip NEUROLOGIC: Cranial nerves II through XII are intact. No focal Motor or sensory deficits b/l.   PSYCHIATRIC: The patient is alert and oriented x 1 SKIN: No obvious rash, lesion, or ulcer.   LABORATORY PANEL:   CBC Recent Labs  Lab 12/31/18 0502  WBC 19.1*  HGB 12.2*  HCT 35.6*  PLT 270   ------------------------------------------------------------------------------------------------------------------ Chemistries  Recent Labs  Lab 12/31/18 0502  NA 135  K 4.2  CL 99  CO2 22  GLUCOSE 399*  BUN 29*  CREATININE 1.70*  CALCIUM 8.9    ------------------------------------------------------------------------------------------------------------------  Cardiac Enzymes No results for input(s): TROPONINI in the last 168 hours. ------------------------------------------------------------------------------------------------------------------  RADIOLOGY:  Dg Tibia/fibula Left  Result Date: 12/30/2018 CLINICAL DATA:  Left leg pain after fall EXAM: LEFT TIBIA AND FIBULA - 2 VIEW COMPARISON:  None. FINDINGS: Postsurgical changes at the ankle with tibiotalar fusion. No evidence of hardware loosening or fracture. Degenerative changes of the knee, better evaluated on dedicated knee radiographs. No evidence of fracture or dislocation. IMPRESSION: Postsurgical changes at the ankle. Degenerative changes at the knee. No evidence of fracture or dislocation. Electronically Signed   By: Charline BillsSriyesh  Krishnan M.D.   On: 12/30/2018 10:41   Ct Head Wo Contrast  Result Date: 12/30/2018 CLINICAL DATA:  Unwitnessed fall, dementia EXAM: CT HEAD WITHOUT CONTRAST TECHNIQUE: Contiguous axial images were obtained from the base of the skull through the vertex without intravenous contrast. COMPARISON:  None. FINDINGS: Motion degraded images. Brain: No evidence of acute infarction, hemorrhage, hydrocephalus, extra-axial collection or mass lesion/mass effect. Subcortical white matter and periventricular small vessel ischemic changes. Global cortical and central atrophy. Vascular: Intracranial atherosclerosis. Skull: Normal. Negative for fracture or focal lesion. Sinuses/Orbits: The visualized paranasal sinuses are essentially clear. The mastoid air cells are unopacified. Other: None. IMPRESSION: Motion degraded images. No evidence of acute intracranial abnormality. Atrophy with small vessel ischemic changes. Electronically Signed   By: Charline BillsSriyesh  Krishnan M.D.   On: 12/30/2018 10:15   Dg Knee Complete 4 Views Left  Result Date: 12/30/2018 CLINICAL DATA:  Left leg pain  after fall EXAM: LEFT KNEE - COMPLETE 4+ VIEW COMPARISON:  None. FINDINGS: No fracture or dislocation is seen. Moderate tricompartmental degenerative changes, most prominent in the medial  compartment. Chondrocalcinosis in the medial and lateral compartments. Visualized soft tissues are within normal limits. No definite suprapatellar knee joint effusion. IMPRESSION: No fracture or dislocation is seen. 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 Electronically Signed   By: Charline Bills M.D.   On: 12/30/2018 10:40   Dg Foot 2 Views Left  Result Date: 12/30/2018 CLINICAL DATA:  Left leg pain after fall EXAM: LEFT FOOT - 2 VIEW COMPARISON:  None. FINDINGS: Status post tibiotalar fusion. No evidence of hardware loosening or fracture. No evidence of fracture or dislocation. The joint spaces are preserved. The visualized soft tissues are unremarkable. IMPRESSION: Status post tibiotalar fusion.  No evidence of complication. No fracture or dislocation is seen. Electronically Signed   By: Charline Bills M.D.   On: 12/30/2018 10:41   Dg Hip Operative Unilat With Pelvis Left  Result Date: 12/31/2018 CLINICAL DATA:  Left IM nail EXAM: OPERATIVE left HIP (WITH PELVIS IF PERFORMED) 3 VIEWS TECHNIQUE: Fluoroscopic spot image(s) were submitted for interpretation post-operatively. FLUOROSCOPY TIME:  42 seconds COMPARISON:  Left hip radiographs dated 12/30/2018 FINDINGS: Intraoperative fluoroscopic images during IM nail with hip screw fixation of an intertrochanteric left hip fracture. Fracture fragments are in near anatomic alignment and position. Distal interlocking screw. IMPRESSION: IM nail with hip screw fixation of intertrochanteric left hip fracture, as above. Electronically Signed   By: Charline Bills M.D.   On: 12/31/2018 10:03   Dg Hip Unilat W Or Wo Pelvis 2-3 Views Left  Result Date: 12/30/2018 CLINICAL DATA:  Left leg pain after fall EXAM: DG HIP (WITH OR WITHOUT PELVIS)  2-3V LEFT COMPARISON:  None. FINDINGS: Nondisplaced intertrochanteric left hip fracture. Bilateral hip joint spaces are preserved. Visualized bony pelvis appears intact. IMPRESSION: Nondisplaced intertrochanteric left hip fracture. Electronically Signed   By: Charline Bills M.D.   On: 12/30/2018 10:39     ASSESSMENT AND PLAN:  73 year old male patient with history of type 2 diabetes mellitus, hypertension, Alzheimer's dementia presented to the emergency room for fall   -Left hip fracture Orthopedic surgery consult appreciated Status post ORIF procedure Physical therapy evaluation in a.m.  -UTI improving IV Rocephin antibiotic on board F/U cultures and sensitivities  -Dehydration resolving IV fluids  -Left hip pain Pain management with oral tramadol and PRN morphine  -Alzheimer's dementia Supportive care One-on-one observation for patient safety  -Type 2 diabetes mellitus Diabetic diet Sliding scale coverage with insulin  -DVT prophylaxis sequential compression device to lower extremities   All the records are reviewed and case discussed with Care Management/Social Worker. Management plans discussed with the patient, family and they are in agreement.  CODE STATUS: Full code  DVT Prophylaxis: SCDs and as per orthopedic recommendation  TOTAL TIME TAKING CARE OF THIS PATIENT: 38 minutes.   POSSIBLE D/C IN 2 to 3 DAYS, DEPENDING ON CLINICAL CONDITION.  Ihor Austin M.D on 12/31/2018 at 1:54 PM  Between 7am to 6pm - Pager - [phone]  After 6pm go to [email or web address] - password [email or web address]  [facility]  Office  [phone]  CC: Primary care physician; Crummett, Lysle Rubens, NP  Note: This dictation was prepared with Dragon dictation along with smaller phrase technology. Any transcriptional errors that result from this process are unintentional.

## 2018-12-31 NOTE — Op Note (Signed)
12/31/2018  9:27 AM  PATIENT:  Arthur [name]  73 y.o. male  PRE-OPERATIVE DIAGNOSIS:  left hip fracture intertrochanteric  POST-OPERATIVE DIAGNOSIS:  same  PROCEDURE: ORIF with IM rod and interlocking screws  SURGEON: Leitha Schuller, MD  ASSISTANTS: None  ANESTHESIA:   spinal  EBL:  Total I/O In: 350 [I.V.:300; IV Piggyback:50] Out: -   BLOOD ADMINISTERED:none  DRAINS: none   LOCAL MEDICATIONS USED:  NONE  SPECIMEN:  No Specimen  DISPOSITION OF SPECIMEN:  N/A  COUNTS:  YES  TOURNIQUET:  * No tourniquets in log *  IMPLANTS: Biomet affixes left 11 x 428 with 110 mm leg screw 56 mm interlocking screw  DICTATION: .Dragon Dictation patient was brought to the operating room and after adequate spinal anesthesia was obtained he was placed on the operative table.  The right leg was in the well-leg holder and left foot in the traction boot with slight traction applied.  C arm was brought in and with good visualization anatomic alignment was obtained.  The hip was then prepped and draped using the barrier drape method and appropriate patient identification and timeout procedures were completed.  With C arm and is an 8 a proximal incision was made and a guidewire was inserted into the tip of the trochanter in the center position on the lateral view.  This was then inserted down the canal part way and proximal reaming carried out.  The long guidewire was then inserted and measurement made off of this for rod length determination reaming was carried out to 12 and half millimeters and 11 x 420 rod was inserted down the canal with adjustments to the depth to get these sent the screw and the center center position this was done using the drill guide through the 130 degree arm on the implant with a small lateral incision getting the drill sleeve down to the bone placing the guidewire then drilling tapping and placing the 110 mm screw.  Traction was released at this point and compression applied.   The guidewire was then removed along with the insertion handle with after tightening down the proximal screw and a quarter turn loosening to allow for further compression.  The good view of the distal oblique screw hole was obtained drilling measuring and placing the 56 mm screw was done with permanent C arm views AP lateral of the proximal femur and an AP view distally showing appropriate screw length.  The wounds were irrigated and closed with #1 Vicryl for deep fascia 2-0 Vicryl subcutaneously and skin staples followed by honeycomb dressing  PLAN OF CARE: Continue as inpatient  PATIENT DISPOSITION:  PACU - hemodynamically stable.

## 2018-12-31 NOTE — Progress Notes (Signed)
Anticoagulation monitoring(Lovenox):  73yo  male ordered Lovenox 30 mg Q24h  Filed Weights   12/30/18 0919  Weight: 160 lb (72.6 kg)   BMI 21.11   Lab Results  Component Value Date   CREATININE 1.70 (H) 12/31/2018   CREATININE 1.09 12/30/2018   CREATININE 1.36 (H) 02/08/2018   Estimated Creatinine Clearance: 39.1 mL/min (A) (by C-G formula based on SCr of 1.7 mg/dL (H)). Hemoglobin & Hematocrit     Component Value Date/Time   HGB 12.2 (L) 12/31/2018 0502   HGB 12.1 (L) 10/28/2012 0416   HCT 35.6 (L) 12/31/2018 0502   HCT 36.0 (L) 10/28/2012 0416     Per Protocol for Patient with estCrcl > 30 ml/min and BMI < 40, will transition to Lovenox 40 mg Q24h.

## 2018-12-31 NOTE — Transfer of Care (Signed)
Immediate Anesthesia Transfer of Care Note  Patient: Arthur [name]  Procedure(s) Performed: CANNULATED HIP PINNING (Left )  Patient Location: PACU  Anesthesia Type:Spinal  Level of Consciousness: sedated  Airway & Oxygen Therapy: Patient Spontanous Breathing and Patient connected to nasal cannula oxygen  Post-op Assessment: Report given to RN and Post -op Vital signs reviewed and stable  Post vital signs: Reviewed and stable  Last Vitals:  Vitals Value Taken Time  BP 101/62 12/31/2018  9:29 AM  Temp    Pulse 74 12/31/2018  9:32 AM  Resp 11 12/31/2018  9:32 AM  SpO2 100 % 12/31/2018  9:32 AM  Vitals shown include unvalidated device data.  Last Pain:  Vitals:   12/31/18 0915  TempSrc:   PainSc: Asleep         Complications: No apparent anesthesia complications

## 2018-12-31 NOTE — Progress Notes (Signed)
Dr Sheryle Hail contacted regarding patients vomiting, order for compazine received.

## 2019-01-01 ENCOUNTER — Encounter: Payer: Self-pay | Admitting: Orthopedic Surgery

## 2019-01-01 LAB — CBC
HCT: 28.7 % — ABNORMAL LOW (ref 39.0–52.0)
Hemoglobin: 9.7 g/dL — ABNORMAL LOW (ref 13.0–17.0)
MCH: 30.6 pg (ref 26.0–34.0)
MCHC: 33.8 g/dL (ref 30.0–36.0)
MCV: 90.5 fL (ref 80.0–100.0)
Platelets: 238 10*3/uL (ref 150–400)
RBC: 3.17 MIL/uL — ABNORMAL LOW (ref 4.22–5.81)
RDW: 13.4 % (ref 11.5–15.5)
WBC: 11.2 10*3/uL — ABNORMAL HIGH (ref 4.0–10.5)
nRBC: 0 % (ref 0.0–0.2)

## 2019-01-01 LAB — GLUCOSE, CAPILLARY
Glucose-Capillary: 260 mg/dL — ABNORMAL HIGH (ref 70–99)
Glucose-Capillary: 170 mg/dL — ABNORMAL HIGH (ref 70–99)
Glucose-Capillary: 191 mg/dL — ABNORMAL HIGH (ref 70–99)

## 2019-01-01 LAB — URINE CULTURE
Culture: >=100000 — AB
Special Requests: NORMAL

## 2019-01-01 LAB — BASIC METABOLIC PANEL
Anion gap: 12 (ref 5–15)
BUN: 44 mg/dL — ABNORMAL HIGH (ref 8–23)
CO2: 20 mmol/L — ABNORMAL LOW (ref 22–32)
Calcium: 8.2 mg/dL — ABNORMAL LOW (ref 8.9–10.3)
Chloride: 106 mmol/L (ref 98–111)
Creatinine, Ser: 3.26 mg/dL — ABNORMAL HIGH (ref 0.61–1.24)
GFR calc Af Amer: 20 mL/min — ABNORMAL LOW (ref >60)
GFR calc non Af Amer: 18 mL/min — ABNORMAL LOW (ref >60)
Glucose, Bld: 177 mg/dL — ABNORMAL HIGH (ref 70–99)
Potassium: 3.8 mmol/L (ref 3.5–5.1)
Sodium: 138 mmol/L (ref 135–145)

## 2019-01-01 MED ORDER — ENOXAPARIN SODIUM 30 MG/0.3ML ~~LOC~~ SOLN
30.0000 mg | SUBCUTANEOUS | Status: DC
  Administered 2019-01-02 – 2019-01-03 (×2): 30 mg via SUBCUTANEOUS
  Filled 2019-01-01 (×2): qty 0.3

## 2019-01-01 MED ORDER — POLYETHYLENE GLYCOL 3350 17 G PO PACK
17.0000 g | PACK | Freq: Every day | ORAL | Status: DC
  Administered 2019-01-01 – 2019-01-04 (×4): 17 g via ORAL
  Filled 2019-01-01 (×4): qty 1

## 2019-01-01 MED ORDER — SODIUM CHLORIDE 0.9 % IV SOLN
INTRAVENOUS | Status: DC
  Administered 2019-01-01 – 2019-01-03 (×5): via INTRAVENOUS

## 2019-01-01 NOTE — TOC Initial Note (Signed)
Transition of Care [facility]) - Initial/Assessment Note    Patient Details  Name: Arthur [name] MRN: [id number] Date of Birth: 08-07-1945  [facility] CM/SW Contact:    Arthur Dunker, RN Phone Number: 01/01/2019, 2:38 PM  Clinical Narrative:                 This patient lives alone I attempted to contact Arthur [name], the patient's daughter at [phone] Her voice mail is full and can't accept messages.  The patient is verbal with yes and no answers, he is focused on the television when talking to you and gives inappropraite responses or does not respond.  He can follow commands   sent Bed search and will review the results with the daughter, [name] and PASSR completed   Expected Discharge Plan: Skilled Nursing Facility Barriers to Discharge: Continued Medical Work up   Patient Goals and CMS Choice Patient states their goals for this hospitalization and ongoing recovery are:: go home CMS Medicare.gov Compare Post Acute Care list provided to:: Patient Choice offered to / list presented to : Patient  Expected Discharge Plan and Services Expected Discharge Plan: Skilled Nursing Facility   Discharge Planning Services: CM Consult   Living arrangements for the past 2 months: Single Family Home                                      Prior Living Arrangements/Services Living arrangements for the past 2 months: Single Family Home Lives with:: Self Patient language and need for interpreter reviewed:: No Do you feel safe going back to the place where you live?: No      Need for Family Participation in Patient Care: Yes (Comment) Care giver support system in place?: Yes (comment)   Criminal Activity/Legal Involvement Pertinent to Current Situation/Hospitalization: No - Comment as needed  Activities of Daily Living   ADL Screening (condition at time of admission) Patient's cognitive ability adequate to safely complete daily activities?: No Is the patient deaf or have  difficulty hearing?: No Does the patient have difficulty seeing, even when wearing glasses/contacts?: No Does the patient have difficulty concentrating, remembering, or making decisions?: Yes Patient able to express need for assistance with ADLs?: No Does the patient have difficulty dressing or bathing?: Yes Independently performs ADLs?: No Communication: Independent Dressing (OT): Needs assistance  Permission Sought/Granted Permission sought to share information with : Case Manager Permission granted to share information with : Yes, Verbal Permission Granted              Emotional Assessment Appearance:: Appears stated age Attitude/Demeanor/Rapport: Unable to Assess Affect (typically observed): Unable to Assess Orientation: : Oriented to Self Alcohol / Substance Use: Never Used Psych Involvement: No (comment)  Admission diagnosis:  Pain [R52] Urinary tract infection, acute [N39.0] Closed left hip fracture, initial encounter Arthur [name]) [S72.002A] Patient Active Problem List   Diagnosis Date Noted  . Hip fracture (HCC) 12/30/2018   PCP:  Crummett, Lysle Rubens, NP Pharmacy:   [facility] [place] - [place], [place] - [address] [place] [address] Phone: [phone] Fax: [phone]     Social Determinants of Health (SDOH) Interventions    Readmission Risk Interventions No flowsheet data found.

## 2019-01-01 NOTE — TOC Initial Note (Signed)
Transition of Care [facility]) - Initial/Assessment Note    Patient Details  Name: Arthur [name] MRN: [id number] Date of Birth: 06-Feb-1946  [facility] CM/SW Contact:    Barrie Dunker, RN Phone Number: 01/01/2019, 2:53 PM  Clinical Narrative:                 [name] called me back, she stated that normally he gets around very good unassisted, he is slow but able to walk without a walker, He lives alone, he typically does communicate better but mostly yes or no answers, in the last week he is less communicative, he does have dementia, The patient is having trouble verbalizing  The family has hired a Engineer, production to come in a few hours each week, they know that he is unable to go home alone,  I explained that we sent a bed search for rehab, I encouraged her to go ahead and apply for Medicaid as it can take a while to obtain, she stated that he owns property and was not sure he would qualify, I encouraged her to call DSS for the guidelines and start the process, she agreed.  She requested to go ahead and send bed search out to [facility] area as well however the first choice would be [facility] or [facility]  Expected Discharge Plan: Skilled Nursing Facility Barriers to Discharge: Continued Medical Work up   Patient Goals and CMS Choice Patient states their goals for this hospitalization and ongoing recovery are:: go home CMS Medicare.gov Compare Post Acute Care list provided to:: Patient Choice offered to / list presented to : Patient  Expected Discharge Plan and Services Expected Discharge Plan: Skilled Nursing Facility   Discharge Planning Services: CM Consult   Living arrangements for the past 2 months: Single Family Home                                      Prior Living Arrangements/Services Living arrangements for the past 2 months: Single Family Home Lives with:: Self Patient language and need for interpreter reviewed:: No Do you feel safe going back to the place where  you live?: No      Need for Family Participation in Patient Care: Yes (Comment) Care giver support system in place?: Yes (comment)   Criminal Activity/Legal Involvement Pertinent to Current Situation/Hospitalization: No - Comment as needed  Activities of Daily Living   ADL Screening (condition at time of admission) Patient's cognitive ability adequate to safely complete daily activities?: No Is the patient deaf or have difficulty hearing?: No Does the patient have difficulty seeing, even when wearing glasses/contacts?: No Does the patient have difficulty concentrating, remembering, or making decisions?: Yes Patient able to express need for assistance with ADLs?: No Does the patient have difficulty dressing or bathing?: Yes Independently performs ADLs?: No Communication: Independent Dressing (OT): Needs assistance  Permission Sought/Granted Permission sought to share information with : Case Manager Permission granted to share information with : Yes, Verbal Permission Granted              Emotional Assessment Appearance:: Appears stated age Attitude/Demeanor/Rapport: Unable to Assess Affect (typically observed): Unable to Assess Orientation: : Oriented to Self Alcohol / Substance Use: Never Used Psych Involvement: No (comment)  Admission diagnosis:  Pain [R52] Urinary tract infection, acute [N39.0] Closed left hip fracture, initial encounter [facility]) [S72.002A] Patient Active Problem List   Diagnosis Date Noted  .  Hip fracture (HCC) 12/30/2018   PCP:  Crummett, Lysle Rubensaniel D, NP Pharmacy:   [facility] [place] - [place], [place] - [address] [place] [address] Phone: [phone] Fax: [phone]     Social Determinants of Health (SDOH) Interventions    Readmission Risk Interventions No flowsheet data found.

## 2019-01-01 NOTE — NC FL2 (Signed)
Arthur [name] MEDICAID [name] LEVEL OF CARE SCREENING TOOL     IDENTIFICATION  Patient Name: Arthur [name] Birthdate: 12/11/1945 Sex: male Admission Date (Current Location): 12/30/2018  [place]   Facility and Address:  [facility], [address], [place], [place] [address]      Provider Number: [id number]  Attending Physician Name and Address:  Ihor AustinPyreddy, Pavan, MD  Relative Name and Phone Number:  [name], Daughter [phone]    Current Level of Care: Hospital Recommended Level of Care: Skilled Nursing Facility Prior Approval Number:    Date Approved/Denied: 10/27/12 PASRR Number: [id number]  Discharge Plan: SNF    Current Diagnoses: Patient Active Problem List   Diagnosis Date Noted  . Hip fracture (HCC) 12/30/2018    Orientation RESPIRATION BLADDER Height & Weight     Self, Time, Situation, Place  Normal Continent Weight: 72.6 kg Height:  6\' 1"  (185.4 cm)  BEHAVIORAL SYMPTOMS/MOOD NEUROLOGICAL BOWEL NUTRITION STATUS      Continent Diet(Carb Modified)  AMBULATORY STATUS COMMUNICATION OF NEEDS Skin   Extensive Assist Verbally Normal, Surgical wounds                       Personal Care Assistance Level of Assistance  Bathing, Dressing Bathing Assistance: Limited assistance   Dressing Assistance: Limited assistance     Functional Limitations Info  Sight, Hearing, Speech Sight Info: Adequate Hearing Info: Adequate Speech Info: Adequate    SPECIAL CARE FACTORS FREQUENCY  PT (By licensed PT)     PT Frequency: 5 times per week              Contractures Contractures Info: Not present    Additional Factors Info  Code Status Code Status Info: FUll code             Current Medications (01/01/2019):  This is the current hospital active medication list Current Facility-Administered Medications  Medication Dose Route Frequency Provider Last Rate Last Dose  . 0.9 %  sodium chloride infusion   250 mL Intravenous PRN Kennedy BuckerMenz, Michael, MD      . 0.9 %  sodium chloride infusion   Intravenous Continuous Ihor AustinPyreddy, Pavan, MD 100 mL/hr at 01/01/19 1423    . acetaminophen (TYLENOL) tablet 650 mg  650 mg Oral Q6H PRN Kennedy BuckerMenz, Michael, MD       Or  . acetaminophen (TYLENOL) suppository 650 mg  650 mg Rectal Q6H PRN Kennedy BuckerMenz, Michael, MD      . alum & mag hydroxide-simeth (MAALOX/MYLANTA) 200-200-20 MG/5ML suspension 30 mL  30 mL Oral Q4H PRN Kennedy BuckerMenz, Michael, MD      . atorvastatin (LIPITOR) tablet 10 mg  10 mg Oral q1800 Kennedy BuckerMenz, Michael, MD   10 mg at 12/31/18 1653  . bisacodyl (DULCOLAX) suppository 10 mg  10 mg Rectal Daily PRN Kennedy BuckerMenz, Michael, MD      . cefTRIAXone (ROCEPHIN) 1 g in sodium chloride 0.9 % 100 mL IVPB  1 g Intravenous Q24H Kennedy BuckerMenz, Michael, MD 200 mL/hr at 01/01/19 1228 1 g at 01/01/19 1228  . docusate sodium (COLACE) capsule 100 mg  100 mg Oral BID Kennedy BuckerMenz, Michael, MD   100 mg at 01/01/19 0829  . [START ON 01/02/2019] enoxaparin (LOVENOX) injection 30 mg  30 mg Subcutaneous Q24H Pyreddy, Pavan, MD      . HYDROcodone-acetaminophen (NORCO/VICODIN) 5-325 MG per tablet 1-2 tablet  1-2 tablet Oral Q4H PRN Kennedy BuckerMenz, Michael, MD   1 tablet at 01/01/19 0558  .  insulin aspart (novoLOG) injection 0-5 Units  0-5 Units Subcutaneous QHS Kennedy Bucker, MD   2 Units at 12/31/18 2146  . insulin aspart (novoLOG) injection 0-9 Units  0-9 Units Subcutaneous TID WC Kennedy Bucker, MD   2 Units at 01/01/19 1226  . magnesium citrate solution 1 Bottle  1 Bottle Oral Once PRN Kennedy Bucker, MD      . menthol-cetylpyridinium (CEPACOL) lozenge 3 mg  1 lozenge Oral PRN Kennedy Bucker, MD       Or  . phenol (CHLORASEPTIC) mouth spray 1 spray  1 spray Mouth/Throat PRN Kennedy Bucker, MD      . methocarbamol (ROBAXIN) tablet 500 mg  500 mg Oral Q6H PRN Kennedy Bucker, MD   500 mg at 01/01/19 0932   Or  . methocarbamol (ROBAXIN) 500 mg in dextrose 5 % 50 mL IVPB  500 mg Intravenous Q6H PRN Kennedy Bucker, MD      . metoCLOPramide (REGLAN)  tablet 5-10 mg  5-10 mg Oral Q8H PRN Kennedy Bucker, MD       Or  . metoCLOPramide (REGLAN) injection 5-10 mg  5-10 mg Intravenous Q8H PRN Kennedy Bucker, MD      . morphine 2 MG/ML injection 2 mg  2 mg Intravenous Q4H PRN Kennedy Bucker, MD   2 mg at 12/31/18 1600  . ondansetron (ZOFRAN) tablet 4 mg  4 mg Oral Q6H PRN Kennedy Bucker, MD       Or  . ondansetron [facility]) injection 4 mg  4 mg Intravenous Q6H PRN Kennedy Bucker, MD   4 mg at 12/31/18 0244  . polyethylene glycol (MIRALAX / GLYCOLAX) packet 17 g  17 g Oral Daily Pyreddy, Pavan, MD   17 g at 01/01/19 1423  . prochlorperazine (COMPAZINE) injection 10 mg  10 mg Intravenous Q6H PRN Kennedy Bucker, MD   10 mg at 12/31/18 0426  . QUEtiapine (SEROQUEL) tablet 25 mg  25 mg Oral QHS PRN Kennedy Bucker, MD   25 mg at 12/31/18 2147  . senna-docusate (Senokot-S) tablet 1 tablet  1 tablet Oral QHS PRN Kennedy Bucker, MD      . sertraline (ZOLOFT) tablet 100 mg  100 mg Oral Daily Kennedy Bucker, MD   100 mg at 01/01/19 0829  . sodium chloride flush (NS) 0.9 % injection 3 mL  3 mL Intravenous Q12H Kennedy Bucker, MD   3 mL at 12/31/18 2147  . sodium chloride flush (NS) 0.9 % injection 3 mL  3 mL Intravenous PRN Kennedy Bucker, MD      . traMADol Janean Sark) tablet 50 mg  50 mg Oral Q6H PRN Kennedy Bucker, MD   50 mg at 01/01/19 0829  . vitamin B-12 (CYANOCOBALAMIN) tablet 1,000 mcg  1,000 mcg Oral Daily Kennedy Bucker, MD   1,000 mcg at 01/01/19 0830     Discharge Medications: Please see discharge summary for a list of discharge medications.  Relevant Imaging Results:  Relevant Lab Results:   Additional Information [id number]  Barrie Dunker, RN

## 2019-01-01 NOTE — Progress Notes (Signed)
[facility]   PATIENT NAME: Arthur [name]    MR#:  [id number]  DATE OF BIRTH:  06/21/1946  SUBJECTIVE:  CHIEF COMPLAINT:   Chief Complaint  Patient presents with  . Fall  Patient seen and evaluated today Status post orthopedic surgery Tolerated procedure well Tolerating pain okay Pleasantly confused  REVIEW OF SYSTEMS:    ROS  Demented  DRUG ALLERGIES:  No Known Allergies  VITALS:  Blood pressure 116/67, pulse (!) 103, temperature 98.2 F (36.8 C), temperature source Oral, resp. rate 18, height 6\' 1"  (1.854 m), weight 72.6 kg, SpO2 92 %.  PHYSICAL EXAMINATION:   Physical Exam  GENERAL:  73 y.o.-year-old patient lying in the bed with no acute distress.  EYES: Pupils equal, round, reactive to light and accommodation. No scleral icterus. Extraocular muscles intact.  HEENT: Head atraumatic, normocephalic. Oropharynx and nasopharynx clear.  NECK:  Supple, no jugular venous distention. No thyroid enlargement, no tenderness.  LUNGS: Normal breath sounds bilaterally, no wheezing, rales, rhonchi. No use of accessory muscles of respiration.  CARDIOVASCULAR: S1, S2 normal. No murmurs, rubs, or gallops.  ABDOMEN: Soft, nontender, nondistended. Bowel sounds present. No organomegaly or mass.  EXTREMITIES: No cyanosis, clubbing or edema b/l.    Bandaged left hip NEUROLOGIC: Cranial nerves II through XII are intact. No focal Motor or sensory deficits b/l.   PSYCHIATRIC: The patient is alert and oriented x 1 SKIN: No obvious rash, lesion, or ulcer.   LABORATORY PANEL:   CBC Recent Labs  Lab 01/01/19 0421  WBC 11.2*  HGB 9.7*  HCT 28.7*  PLT 238   ------------------------------------------------------------------------------------------------------------------ Chemistries  Recent Labs  Lab 01/01/19 0421  NA 138  K 3.8  CL 106  CO2 20*  GLUCOSE 177*  BUN 44*  CREATININE 3.26*  CALCIUM 8.2*    ------------------------------------------------------------------------------------------------------------------  Cardiac Enzymes No results for input(s): TROPONINI in the last 168 hours. ------------------------------------------------------------------------------------------------------------------  RADIOLOGY:  Dg Hip Operative Unilat With Pelvis Left  Result Date: 12/31/2018 CLINICAL DATA:  Left IM nail EXAM: OPERATIVE left HIP (WITH PELVIS IF PERFORMED) 3 VIEWS TECHNIQUE: Fluoroscopic spot image(s) were submitted for interpretation post-operatively. FLUOROSCOPY TIME:  42 seconds COMPARISON:  Left hip radiographs dated 12/30/2018 FINDINGS: Intraoperative fluoroscopic images during IM nail with hip screw fixation of an intertrochanteric left hip fracture. Fracture fragments are in near anatomic alignment and position. Distal interlocking screw. IMPRESSION: IM nail with hip screw fixation of intertrochanteric left hip fracture, as above. Electronically Signed   By: Charline BillsSriyesh  Krishnan M.D.   On: 12/31/2018 10:03     ASSESSMENT AND PLAN:  73 year old male patient with history of type 2 diabetes mellitus, hypertension, Alzheimer's dementia presented to the emergency room for fall   -Left hip fracture Orthopedic surgery consult appreciated Status post ORIF procedure Physical therapy evaluation today  -Acute kidney injury IV fluid hydration Avoid nephrotoxic medication Monitor renal function  -UTI improving IV Rocephin antibiotic on board F/U cultures and sensitivities  -Dehydration resolving IV fluids  -Left hip pain Pain management with oral tramadol and PRN morphine  -Alzheimer's dementia Supportive care One-on-one observation for patient safety  -Type 2 diabetes mellitus Diabetic diet Sliding scale coverage with insulin  -DVT prophylaxis  With subcu Lovenox daily   All the records are reviewed and case discussed with Care Management/Social  Worker. Management plans discussed with the patient, family and they are in agreement.  CODE STATUS: Full code  DVT Prophylaxis: SCDs and as per orthopedic recommendation  TOTAL TIME  TAKING CARE OF THIS PATIENT: 35 minutes.   POSSIBLE D/C IN 2 to 3 DAYS, DEPENDING ON CLINICAL CONDITION.  Ihor Austin M.D on 01/01/2019 at 12:27 PM  Between 7am to 6pm - Pager - [phone]  After 6pm go to [email or web address] - password [email or web address]  [facility] [place] [facility]  Office  [phone]  CC: Primary care physician; Crummett, Lysle Rubens, NP  Note: This dictation was prepared with Dragon dictation along with smaller phrase technology. Any transcriptional errors that result from this process are unintentional.

## 2019-01-01 NOTE — Evaluation (Addendum)
Physical Therapy Evaluation Patient Details Name: Arthur DaftDonald L Mocarski MRN: [id number] DOB: 05/30/1946 Today's Date: 01/01/2019   History of Present Illness  Dennis BastDonald Plouff is a 73yo male who [facility] after found on floor by family. Pt noted to have Left hip fracture, presents now s/p ORIF IM nailing of Left hip WBAT. PMH: Alzheimer's dementia, DM2, HTN, hemorrhagic CVA, Lt ankle fusion.   Clinical Impression  Pt admitted with above diagnosis. Pt currently with functional limitations due to the deficits listed below (see "PT Problem List"). Upon entry, pt in bed, awake and intermittently interactive, makes eye contact at times. Responds verbally to questions ~25% of time, better when not in pain or anxious. Pt is largely non-verbal, unable to provide any useable history, but does follow tactile and verbal cues >50% of time. TotalA+2 for all bed mobility, pt trying his best to participate but clearly derailed by pain when moving. At EOB pt has more grimacing, less eye contact, less verbally responsive to commands, clearly a little fearful of falling forward into floor. Functional mobility assessment demonstrates increased effort/time requirements, poor tolerance, and need for heavy physical assistance, whereas the patient performed these at presumably a higher level of independence PTA from what limited info is available. Pt will benefit from skilled PT intervention to increase independence and safety with basic mobility in preparation for discharge to the venue listed below.       Follow Up Recommendations SNF;Supervision/Assistance - 24 hour;Supervision for mobility/OOB    Equipment Recommendations  (can be determined by receiving facility)    Recommendations for Other Services       Precautions / Restrictions Precautions Precautions: Fall Restrictions Weight Bearing Restrictions: Yes LLE Weight Bearing: Weight bearing as tolerated      Mobility  Bed Mobility Overal bed mobility:  Needs Assistance Bed Mobility: Supine to Sit     Supine to sit: +2 for physical assistance     General bed mobility comments: Pt follows cues and attempts to help with lateral scoot of pelvis and trunk.(at EOB, able to sit independently for several minutes.)  Transfers Overall transfer level: Needs assistance               General transfer comment: attempted several times, pt shuts down liekly 2/2 to anxiety/fear. Not actively following cues for transfer.   Ambulation/Gait                Stairs            Wheelchair Mobility    Modified Rankin (Stroke Patients Only)       Balance Overall balance assessment: History of Falls;Needs assistance Sitting-balance support: No upper extremity supported;Feet supported Sitting balance-Leahy Scale: Good Sitting balance - Comments: 1-2 minutes to establish balance- tolerates sitting for 10-12 minutes                                     Pertinent Vitals/Pain Pain Assessment: Faces Faces Pain Scale: Hurts even more Pain Location: Left leg;  Pain Descriptors / Indicators: Operative site guarding;Grimacing Pain Intervention(s): Limited activity within patient's tolerance;Monitored during session;Repositioned    Home Living Family/patient expects to be discharged to:: (Not available at time of eval; patient unable to provide.)                      Prior Function  Hand Dominance        Extremity/Trunk Assessment   Upper Extremity Assessment Upper Extremity Assessment: Difficult to assess due to impaired cognition    Lower Extremity Assessment Lower Extremity Assessment: Difficult to assess due to impaired cognition       Communication   Communication: Expressive difficulties;Receptive difficulties(presents similar to deficits associated with late stage dementia; questionably baseline)  Cognition Arousal/Alertness: Awake/alert Behavior During Therapy: WFL for  tasks assessed/performed;Flat affect(difficulty to visualize nerousness/anxiety, but common presentation in non-verbal population with less eye contact, less interaction while at EOB, no longer following cues) Overall Cognitive Status: No family/caregiver present to determine baseline cognitive functioning                                        General Comments      Exercises General Exercises - Lower Extremity Heel Slides: AAROM;Left;10 reps;Supine;Limitations Heel Slides Limitations: given extensive verbal cues and instructions, pt responding minimally verbally, but does participate with cues. initial pain improves with repitition Hip ABduction/ADduction: Left;AAROM;Supine;10 reps;Limitations Hip Abduction/Adduction Limitations: given extensive verbal cues and instructions, pt responding minimally verbally, but does participate with cues. initial pain improves with repitition   Assessment/Plan    PT Assessment Patient needs continued PT services  PT Problem List Decreased strength;Decreased activity tolerance;Decreased mobility;Decreased knowledge of precautions       PT Treatment Interventions DME instruction;Therapeutic exercise;Gait training;Stair training;Functional mobility training;Therapeutic activities;Patient/family education;Cognitive remediation;Balance training    PT Goals (Current goals can be found in the Care Plan section)  Acute Rehab PT Goals PT Goal Formulation: Patient unable to participate in goal setting Time For Goal Achievement: 01/15/19    Frequency 7X/week   Barriers to discharge Inaccessible home environment;Decreased caregiver support No clear available detail regarding living situation and home setup    Co-evaluation               AM-PAC PT "6 Clicks" Mobility  Outcome Measure Help needed turning from your back to your side while in a flat bed without using bedrails?: Total Help needed moving from lying on your back to  sitting on the side of a flat bed without using bedrails?: Total Help needed moving to and from a bed to a chair (including a wheelchair)?: Total Help needed standing up from a chair using your arms (e.g., wheelchair or bedside chair)?: Total Help needed to walk in hospital room?: Total Help needed climbing 3-5 steps with a railing? : Total 6 Click Score: 6    End of Session Equipment Utilized During Treatment: Gait belt Activity Tolerance: Patient tolerated treatment well;Patient limited by fatigue;Patient limited by pain;Other (comment)(Pt anxious at EOB) Patient left: in bed;with call bell/phone within reach;with SCD's reapplied;with nursing/sitter in room Nurse Communication: Mobility status PT Visit Diagnosis: Unsteadiness on feet (R26.81);Other abnormalities of gait and mobility (R26.89);Difficulty in walking, not elsewhere classified (R26.2)    Time: 1030-1102 PT Time Calculation (min) (ACUTE ONLY): 32 min   Charges:   PT Evaluation $PT Eval Moderate Complexity: 1 Mod PT Treatments $Therapeutic Exercise: 8-22 mins        12:44 PM, 01/01/19 Rosamaria Lints, PT, DPT Physical Therapist - [facility]  [phone] (ASCOM)    Patrick Sohm C 01/01/2019, 12:33 PM

## 2019-01-01 NOTE — Progress Notes (Signed)
Inpatient Diabetes Program Recommendations  AACE/ADA: New Consensus Statement on Inpatient Glycemic Control   Target Ranges:  Prepandial:   less than 140 mg/dL      Peak postprandial:   less than 180 mg/dL (1-2 hours)      Critically ill patients:  140 - 180 mg/dL  Results for [name], [name] (MRN [id number]) as of 01/01/2019 12:49  Ref. Range 12/31/2018 07:24 12/31/2018 09:42 12/31/2018 10:40 12/31/2018 11:34 12/31/2018 16:44 12/31/2018 21:01 01/01/2019 07:48 01/01/2019 11:44  Glucose-Capillary Latest Ref Range: 70 - 99 mg/dL 852 (H) 778 (H) 242 (H) 212 (H) 102 (H) 204 (H) 260 (H) 170 (H)    Review of Glycemic Control  Diabetes history: DM2 Outpatient Diabetes medications: Levemir 15 units QPM, Metformin 850 mg BID, Amaryl 2 mg QAM Current orders for Inpatient glycemic control: Novolog 0-9 units TID with meals, Novolog 0-5 units QHS  Inpatient Diabetes Program Recommendations:   Insulin - Basal: Please consider ordering Levemir 5 units Q24H.  Thanks, Orlando Penner, RN, MSN, CDE Diabetes Coordinator Inpatient Diabetes Program [phone] (Team Pager from 8am to 5pm)

## 2019-01-01 NOTE — Progress Notes (Signed)
   Subjective: 1 Day Post-Op Procedure(s) (LRB): CANNULATED HIP PINNING (Left) Patient reports pain as mild.   Patient is well, and has had no acute complaints or problems Denies any CP, SOB, ABD pain. We will continue therapy today.    Objective: Vital signs in last 24 hours: Temp:  [96.9 F (36.1 C)-99.2 F (37.3 C)] 98.9 F (37.2 C) (06/01 0702) Pulse Rate:  [73-108] 108 (06/01 0702) Resp:  [9-19] 18 (06/01 0702) BP: (80-120)/(54-92) 120/75 (06/01 0702) SpO2:  [92 %-100 %] 92 % (06/01 0702)  Intake/Output from previous day: 05/31 0701 - 06/01 0700 In: 1818.5 [P.O.:240; I.V.:1355.1; IV Piggyback:223.4] Out: 470 [Urine:320; Blood:150] Intake/Output this shift: No intake/output data recorded.  Recent Labs    12/30/18 0923 12/31/18 0502 01/01/19 0421  HGB 12.4* 12.2* 9.7*   Recent Labs    12/31/18 0502 01/01/19 0421  WBC 19.1* 11.2*  RBC 3.98* 3.17*  HCT 35.6* 28.7*  PLT 270 238   Recent Labs    12/31/18 0502 01/01/19 0421  NA 135 138  K 4.2 3.8  CL 99 106  CO2 22 20*  BUN 29* 44*  CREATININE 1.70* 3.26*  GLUCOSE 399* 177*  CALCIUM 8.9 8.2*   Recent Labs    12/30/18 0923  INR 1.1    EXAM General - Patient is Alert, Appropriate and Oriented Extremity - Neurovascular intact Sensation intact distally Intact pulses distally No cellulitis present Compartment soft Dressing - dressing C/D/I and scant drainage Motor Function - intact, moving foot and toes well on exam.   Past Medical History:  Diagnosis Date  . Alzheimer disease (HCC)   . Diabetes (HCC)   . Hypertension     Assessment/Plan:   1 Day Post-Op Procedure(s) (LRB): CANNULATED HIP PINNING (Left) Active Problems:   Hip fracture (HCC)  Estimated body mass index is 21.11 kg/m as calculated from the following:   Height as of this encounter: 6\' 1"  (1.854 m).   Weight as of this encounter: 72.6 kg. Advance diet Up with therapy  Needs BM Acute post op blood loss anemia - Hgb 9.7.  Recheck Hgb in the am CM to assist with discharge   DVT Prophylaxis - Lovenox, TED hose and SCDs Weight-Bearing as tolerated to left leg   T. Cranston Neighbor, PA-C [facility] 01/01/2019, 8:08 AM

## 2019-01-01 NOTE — Anesthesia Postprocedure Evaluation (Signed)
Anesthesia Post Note  Patient: Arthur [name]  Procedure(s) Performed: CANNULATED HIP PINNING (Left )  Patient location during evaluation: Nursing Unit Anesthesia Type: Spinal Level of consciousness: oriented and awake and alert Pain management: pain level controlled Vital Signs Assessment: post-procedure vital signs reviewed and stable Respiratory status: spontaneous breathing and respiratory function stable Cardiovascular status: blood pressure returned to baseline and stable Postop Assessment: no headache, no backache, no apparent nausea or vomiting and patient able to bend at knees Anesthetic complications: no     Last Vitals:  Vitals:   01/01/19 0402 01/01/19 0702  BP: 110/72 120/75  Pulse: 98 (!) 108  Resp: 18 18  Temp: 36.9 C 37.2 C  SpO2: 96% 92%    Last Pain:  Vitals:   01/01/19 0702  TempSrc: Oral  PainSc:                  [name]

## 2019-01-01 NOTE — Plan of Care (Signed)

## 2019-01-02 ENCOUNTER — Inpatient Hospital Stay: Payer: Medicare Other

## 2019-01-02 LAB — BASIC METABOLIC PANEL
Anion gap: 12 (ref 5–15)
BUN: 60 mg/dL — ABNORMAL HIGH (ref 8–23)
CO2: 19 mmol/L — ABNORMAL LOW (ref 22–32)
Calcium: 8.1 mg/dL — ABNORMAL LOW (ref 8.9–10.3)
Chloride: 107 mmol/L (ref 98–111)
Creatinine, Ser: 4.63 mg/dL — ABNORMAL HIGH (ref 0.61–1.24)
GFR calc Af Amer: 13 mL/min — ABNORMAL LOW (ref >60)
GFR calc non Af Amer: 12 mL/min — ABNORMAL LOW (ref >60)
Glucose, Bld: 221 mg/dL — ABNORMAL HIGH (ref 70–99)
Potassium: 4.1 mmol/L (ref 3.5–5.1)
Sodium: 138 mmol/L (ref 135–145)

## 2019-01-02 LAB — GLUCOSE, CAPILLARY
Glucose-Capillary: 220 mg/dL — ABNORMAL HIGH (ref 70–99)
Glucose-Capillary: 226 mg/dL — ABNORMAL HIGH (ref 70–99)
Glucose-Capillary: 186 mg/dL — ABNORMAL HIGH (ref 70–99)
Glucose-Capillary: 140 mg/dL — ABNORMAL HIGH (ref 70–99)

## 2019-01-02 MED ORDER — CEPHALEXIN 250 MG PO CAPS
250.0000 mg | ORAL_CAPSULE | Freq: Two times a day (BID) | ORAL | Status: DC
  Administered 2019-01-03 (×2): 250 mg via ORAL
  Filled 2019-01-02 (×3): qty 1

## 2019-01-02 MED ORDER — INSULIN DETEMIR 100 UNIT/ML ~~LOC~~ SOLN
5.0000 [IU] | Freq: Every day | SUBCUTANEOUS | Status: DC
  Administered 2019-01-02 – 2019-01-03 (×2): 5 [IU] via SUBCUTANEOUS
  Filled 2019-01-02 (×3): qty 0.05

## 2019-01-02 MED ORDER — TAMSULOSIN HCL 0.4 MG PO CAPS
0.4000 mg | ORAL_CAPSULE | Freq: Every day | ORAL | Status: DC
  Administered 2019-01-02 – 2019-01-03 (×2): 0.4 mg via ORAL
  Filled 2019-01-02 (×2): qty 1

## 2019-01-02 NOTE — Consult Note (Signed)
[address] [place], [place] [address] Phone [phone]. FAx [phone]  Date: 01/02/2019                  Patient Name:  Arthur [name]  MRN: [id number]  DOB: 08/11/1945  Age / Sex: 73 y.o., male         PCP: [name], Arthur Rubensaniel D, NP                 Service Requesting Consult: IM/ Arthur AustinPyreddy, Pavan, MD                 Reason for Consult: ARF            History of Present Illness: Patient is a 73 y.o. male with medical problems of dementia, DM, HTN, h/o Left Tibiofibular fusion, h/o hemorrhagic stroke [facility] for evaluation of unwitnessed fall. Dx with Left Hip fracture. Underwent ORIF on OR on 5/31 Results for Arthur [name], Arthur [name] (MRN [id number]) as of 01/02/2019 14:15  Ref. Range 12/30/2018 09:23 12/31/2018 05:02 01/01/2019 04:21 01/02/2019 03:32  Creatinine Latest Ref Range: 0.61 - 1.24 mg/dL 1.321.09 4.401.70 (H) 1.023.26 (H) 4.63 (H)   Renal u/s shows bladder outlet obstruction and b.[name] hydronephrosis CK normal  Medications: Outpatient medications: Medications Prior to Admission  Medication Sig Dispense Refill Last Dose  . atorvastatin (LIPITOR) 10 MG tablet Take 10 mg by mouth daily.   12/29/2018 at 1800  . glimepiride (AMARYL) 2 MG tablet Take 2 mg by mouth daily with breakfast.   12/29/2018 at 0800  . Insulin Detemir (LEVEMIR FLEXTOUCH) 100 UNIT/ML Pen Inject 15 Units into the skin every evening.   12/29/2018 at 1800  . metFORMIN (GLUCOPHAGE) 850 MG tablet Take 850 mg by mouth 2 (two) times daily with a meal.   12/29/2018 at 1800  . sertraline (ZOLOFT) 50 MG tablet Take 100 mg by mouth daily.   12/29/2018 at 1800  . vitamin B-12 (CYANOCOBALAMIN) 1000 MCG tablet Take 1,000 mcg by mouth daily.   12/29/2018 at 0800  . ondansetron (ZOFRAN) 4 MG tablet Take 4 mg by mouth every 8 (eight) hours as needed for nausea or vomiting.   Completed Course at Unknown time  . traMADol (ULTRAM) 50 MG tablet Take by mouth every 6 (six) hours as needed.   Completed Course at  Unknown time    Current medications: Current Facility-Administered Medications  Medication Dose Route Frequency Provider Last Rate Last Dose  . 0.9 %  sodium chloride infusion  250 mL Intravenous PRN Kennedy BuckerMenz, Michael, MD      . 0.9 %  sodium chloride infusion   Intravenous Continuous Pyreddy, Vivien RotaPavan, MD 100 mL/hr at 01/02/19 1110    . acetaminophen (TYLENOL) tablet 650 mg  650 mg Oral Q6H PRN Kennedy BuckerMenz, Michael, MD       Or  . acetaminophen (TYLENOL) suppository 650 mg  650 mg Rectal Q6H PRN Kennedy BuckerMenz, Michael, MD      . alum & mag hydroxide-simeth (MAALOX/MYLANTA) 200-200-20 MG/5ML suspension 30 mL  30 mL Oral Q4H PRN Kennedy BuckerMenz, Michael, MD      . atorvastatin (LIPITOR) tablet 10 mg  10 mg Oral q1800 Kennedy BuckerMenz, Michael, MD   10 mg at 01/01/19 1804  . bisacodyl (DULCOLAX) suppository 10 mg  10 mg Rectal Daily PRN Kennedy BuckerMenz, Michael, MD      . cefTRIAXone (ROCEPHIN) 1 g in sodium chloride 0.9 % 100 mL IVPB  1 g Intravenous Q24H Kennedy BuckerMenz, Michael, MD 200 mL/hr at  01/02/19 1246 1 g at 01/02/19 1246  . docusate sodium (COLACE) capsule 100 mg  100 mg Oral BID Kennedy Bucker, MD   100 mg at 01/02/19 1106  . enoxaparin (LOVENOX) injection 30 mg  30 mg Subcutaneous Q24H Arthur Austin, MD   30 mg at 01/02/19 0756  . HYDROcodone-acetaminophen (NORCO/VICODIN) 5-325 MG per tablet 1-2 tablet  1-2 tablet Oral Q4H PRN Kennedy Bucker, MD   1 tablet at 01/02/19 0856  . insulin aspart (novoLOG) injection 0-5 Units  0-5 Units Subcutaneous QHS Kennedy Bucker, MD   2 Units at 12/31/18 2146  . insulin aspart (novoLOG) injection 0-9 Units  0-9 Units Subcutaneous TID WC Kennedy Bucker, MD   3 Units at 01/02/19 1243  . magnesium citrate solution 1 Bottle  1 Bottle Oral Once PRN Kennedy Bucker, MD      . menthol-cetylpyridinium (CEPACOL) lozenge 3 mg  1 lozenge Oral PRN Kennedy Bucker, MD       Or  . phenol (CHLORASEPTIC) mouth spray 1 spray  1 spray Mouth/Throat PRN Kennedy Bucker, MD      . metoCLOPramide (REGLAN) tablet 5-10 mg  5-10 mg Oral Q8H PRN  Kennedy Bucker, MD       Or  . metoCLOPramide (REGLAN) injection 5-10 mg  5-10 mg Intravenous Q8H PRN Kennedy Bucker, MD      . morphine 2 MG/ML injection 2 mg  2 mg Intravenous Q4H PRN Kennedy Bucker, MD   2 mg at 12/31/18 1600  . ondansetron (ZOFRAN) tablet 4 mg  4 mg Oral Q6H PRN Kennedy Bucker, MD       Or  . ondansetron [facility]) injection 4 mg  4 mg Intravenous Q6H PRN Kennedy Bucker, MD   4 mg at 12/31/18 0244  . polyethylene glycol (MIRALAX / GLYCOLAX) packet 17 g  17 g Oral Daily Pyreddy, Pavan, MD   17 g at 01/02/19 1106  . prochlorperazine (COMPAZINE) injection 10 mg  10 mg Intravenous Q6H PRN Kennedy Bucker, MD   10 mg at 12/31/18 0426  . QUEtiapine (SEROQUEL) tablet 25 mg  25 mg Oral QHS PRN Kennedy Bucker, MD   25 mg at 01/01/19 2220  . senna-docusate (Senokot-S) tablet 1 tablet  1 tablet Oral QHS PRN Kennedy Bucker, MD      . sertraline (ZOLOFT) tablet 100 mg  100 mg Oral Daily Kennedy Bucker, MD   100 mg at 01/02/19 1106  . sodium chloride flush (NS) 0.9 % injection 3 mL  3 mL Intravenous Q12H Kennedy Bucker, MD   3 mL at 01/02/19 1109  . sodium chloride flush (NS) 0.9 % injection 3 mL  3 mL Intravenous PRN Kennedy Bucker, MD      . traMADol Janean Sark) tablet 50 mg  50 mg Oral Q6H PRN Kennedy Bucker, MD   50 mg at 01/01/19 0829  . vitamin B-12 (CYANOCOBALAMIN) tablet 1,000 mcg  1,000 mcg Oral Daily Kennedy Bucker, MD   1,000 mcg at 01/02/19 1107      Allergies: No Known Allergies    Past Medical History: Past Medical History:  Diagnosis Date  . Alzheimer disease (HCC)   . Diabetes (HCC)   . Hypertension      Past Surgical History: Past Surgical History:  Procedure Laterality Date  . HIP PINNING,CANNULATED Left 12/31/2018   Procedure: CANNULATED HIP PINNING;  Surgeon: Kennedy Bucker, MD;  Location: [facility] ORS;  Service: Orthopedics;  Laterality: Left;  . KYPHOPLASTY N/A 01/23/2018   Procedure: [id number];  Surgeon: Kennedy Bucker, MD;  Location: [facility] ORS;  Service: Orthopedics;   Laterality: N/A;     Family History: History reviewed. No pertinent family history.   Social History: Social History   Socioeconomic History  . Marital status: Married    Spouse name: Not on file  . Number of children: Not on file  . Years of education: Not on file  . Highest education level: Not on file  Occupational History  . Not on file  Social Needs  . Financial resource strain: Not on file  . Food insecurity:    Worry: Not on file    Inability: Not on file  . Transportation needs:    Medical: Not on file    Non-medical: Not on file  Tobacco Use  . Smoking status: Former Smoker  Substance and Sexual Activity  . Alcohol use: Not Currently  . Drug use: Never  . Sexual activity: Not Currently  Lifestyle  . Physical activity:    Days per week: Not on file    Minutes per session: Not on file  . Stress: Not on file  Relationships  . Social connections:    Talks on phone: Not on file    Gets together: Not on file    Attends religious service: Not on file    Active member of club or organization: Not on file    Attends meetings of clubs or organizations: Not on file    Relationship status: Not on file  . Intimate partner violence:    Fear of current or ex partner: Not on file    Emotionally abused: Not on file    Physically abused: Not on file    Forced sexual activity: Not on file  Other Topics Concern  . Not on file  Social History Narrative  . Not on file     Review of Systems: Gen:  HEENT:  CV:  Resp:  GI: GU :  MS:  Derm:    Psych: Heme:  Neuro:  Endocrine  Vital Signs: Blood pressure 127/78, pulse 91, temperature 98.3 F (36.8 C), temperature source Oral, resp. rate 18, height 6\' 1"  (1.854 m), weight 72.6 kg, SpO2 95 %.   Intake/Output Summary (Last 24 hours) at 01/02/2019 1407 Last data filed at 01/02/2019 0531 Gross per 24 hour  Intake 545.66 ml  Output 200 ml  Net 345.66 ml    Weight trends: [employer]   12/30/18 0919   Weight: 72.6 kg    Physical Exam: General:  NAD, Laying in bed  HEENT Anicteric, moist oral mucus membranes  Neck:  supple  Lungs: Normal effort on room air, clear  Heart::  regular  Abdomen: Soft, tense suprapubic area  Extremities:  no edema, SCDs in place  Neurologic: Alert, did not follow commands, confused  Skin: warm     Foley: To be placed       Lab results: Basic Metabolic Panel: Recent Labs  Lab 12/31/18 0502 01/01/19 0421 01/02/19 0332  NA 135 138 138  K 4.2 3.8 4.1  CL 99 106 107  CO2 22 20* 19*  GLUCOSE 399* 177* 221*  BUN 29* 44* 60*  CREATININE 1.70* 3.26* 4.63*  CALCIUM 8.9 8.2* 8.1*    Liver Function Tests: No results for input(s): AST, ALT, ALKPHOS, BILITOT, PROT, ALBUMIN in the last 168 hours. No results for input(s): LIPASE, AMYLASE in the last 168 hours. No results for input(s): AMMONIA in the last 168 hours.  CBC: Recent Labs  Lab 12/31/18 0502 01/01/19 0421  WBC  19.1* 11.2*  HGB 12.2* 9.7*  HCT 35.6* 28.7*  MCV 89.4 90.5  PLT 270 238    Cardiac Enzymes: Recent Labs  Lab 12/30/18 0923  CKTOTAL 90    BNP: Invalid input(s): POCBNP  CBG: Recent Labs  Lab 01/01/19 0748 01/01/19 1144 01/01/19 1724 01/02/19 0736 01/02/19 1137  GLUCAP 260* 170* 191* 220* 226*    Microbiology: Recent Results (from the past 720 hour(s))  Urine Culture     Status: Abnormal   Collection Time: 12/30/18 12:07 PM  Result Value Ref Range Status   Specimen Description   Final    URINE, RANDOM Performed at [facility], [address], [place], [place] [address]    Special Requests   Final    Normal Performed at [facility], [address] [place], [place] [address]    Culture >=100,000 COLONIES/mL KLEBSIELLA PNEUMONIAE (A)  Final   Report Status 01/01/2019 FINAL  Final   Organism ID, Bacteria KLEBSIELLA PNEUMONIAE (A)  Final      Susceptibility   Klebsiella pneumoniae - MIC*    AMPICILLIN >=32 RESISTANT Resistant      CEFAZOLIN <=4 SENSITIVE Sensitive     CEFTRIAXONE <=1 SENSITIVE Sensitive     CIPROFLOXACIN <=0.25 SENSITIVE Sensitive     GENTAMICIN <=1 SENSITIVE Sensitive     IMIPENEM <=0.25 SENSITIVE Sensitive     NITROFURANTOIN 128 RESISTANT Resistant     TRIMETH/SULFA <=20 SENSITIVE Sensitive     AMPICILLIN/SULBACTAM 8 SENSITIVE Sensitive     PIP/TAZO <=4 SENSITIVE Sensitive     Extended ESBL NEGATIVE Sensitive     * >=100,000 COLONIES/mL KLEBSIELLA PNEUMONIAE  SARS Coronavirus 2 (CEPHEID - Performed in [facility] hospital lab), Hosp Order     Status: None   Collection Time: 12/30/18 12:07 PM  Result Value Ref Range Status   SARS Coronavirus 2 NEGATIVE NEGATIVE Final    Comment: (NOTE) If result is NEGATIVE SARS-CoV-2 target nucleic acids are NOT DETECTED. The SARS-CoV-2 RNA is generally detectable in upper and lower  respiratory specimens during the acute phase of infection. The lowest  concentration of SARS-CoV-2 viral copies this assay can detect is 250  copies / mL. A negative result does not preclude SARS-CoV-2 infection  and should not be used as the sole basis for treatment or other  patient management decisions.  A negative result may occur with  improper specimen collection / handling, submission of specimen other  than nasopharyngeal swab, presence of viral mutation(s) within the  areas targeted by this assay, and inadequate number of viral copies  (<250 copies / mL). A negative result must be combined with clinical  observations, patient history, and epidemiological information. If result is POSITIVE SARS-CoV-2 target nucleic acids are DETECTED. The SARS-CoV-2 RNA is generally detectable in upper and lower  respiratory specimens dur ing the acute phase of infection.  Positive  results are indicative of active infection with SARS-CoV-2.  Clinical  correlation with patient history and other diagnostic information is  necessary to determine patient infection status.  Positive  results do  not rule out bacterial infection or co-infection with other viruses. If result is PRESUMPTIVE POSTIVE SARS-CoV-2 nucleic acids MAY BE PRESENT.   A presumptive positive result was obtained on the submitted specimen  and confirmed on repeat testing.  While 2019 novel coronavirus  (SARS-CoV-2) nucleic acids may be present in the submitted sample  additional confirmatory testing may be necessary for epidemiological  and / or clinical management purposes  to differentiate  between  SARS-CoV-2 and other Sarbecovirus currently known to infect humans.  If clinically indicated additional testing with an alternate test  methodology [id number]) is advised. The SARS-CoV-2 RNA is generally  detectable in upper and lower respiratory sp ecimens during the acute  phase of infection. The expected result is Negative. Fact Sheet for Patients:  BoilerBrush.com.cy Fact Sheet for Healthcare Providers: https://pope.com/ This test is not yet approved or cleared by the [place] FDA and has been authorized for detection and/or diagnosis of SARS-CoV-2 by FDA under an Emergency Use Authorization (EUA).  This EUA will remain in effect (meaning this test can be used) for the duration of the COVID-19 declaration under Section 564(b)(1) of the Act, 21 U.S.C. section 360bbb-3(b)(1), unless the authorization is terminated or revoked sooner. Performed at [facility], [address] [place], [place] [address]   Surgical PCR screen     Status: None   Collection Time: 12/30/18  5:55 PM  Result Value Ref Range Status   MRSA, PCR NEGATIVE NEGATIVE Final   Staphylococcus aureus NEGATIVE NEGATIVE Final    Comment: (NOTE) The Xpert SA Assay (FDA approved for NASAL specimens in patients 71 years of age and older), is one component of a comprehensive surveillance program. It is not intended to diagnose infection nor to guide or monitor  treatment. Performed at [facility], [address] [place], [place] [address]      Coagulation Studies: No results for input(s): LABPROT, INR in the last 72 hours.  Urinalysis: No results for input(s): COLORURINE, LABSPEC, PHURINE, GLUCOSEU, HGBUR, BILIRUBINUR, KETONESUR, PROTEINUR, UROBILINOGEN, NITRITE, LEUKOCYTESUR in the last 72 hours.  Invalid input(s): APPERANCEUR      Imaging: US Renal  Result Date: 01/02/2019 CLINICAL DATA:  Acute renal failure. EXAM: RENAL / URINARY TRACT ULTRASOUND COMPLETE COMPARISON:  Limited correlation made with a chest CT 01/05/2018. FINDINGS: Right Kidney: Renal measurements: 11.8 x 5.4 x 5.5 cm = volume: 185 mL. Mild-to-moderate hydronephrosis. No significant cortical thinning or focal lesion identified. Left Kidney: Renal measurements: 10.3 x 5.4 x 4.4 cm = volume: 127 mL. Mild-to-moderate hydronephrosis. Mild cortical thinning. Bladder: The bladder is distended with dependent debris. Pre voiding bladder volume approximately 1448 cc. Patient unable to void during the examination. IMPRESSION: 1. Mild-to-moderate bilateral hydronephrosis, nonspecific in etiology given the distended bladder. This could be due to bladder outlet obstruction. Repeat ultrasound after bladder emptying or Foley catheter placement could be performed to exclude ureteral obstruction. 2. Debris in the bladder. 3. Mild renal cortical thinning on the left. Electronically Signed   By: Carey Bullocks M.D.   On: 01/02/2019 11:17      Assessment & Plan: Pt is a 73 y.o.  caucasian male with medical problems of dementia, DM, HTN, h/o Left Tibiofibular fusion, h/o hemorrhagic stroke , was admitted on 12/30/2018 with Left Hip Fracture. Underwent ORIF 5/31   1. 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 Klebsiella UTI - currently on iv Rocephin  Will follow     LOS: 3 Arthur [name] 6/2/20202:07 PM    Note: This note was prepared with Dragon dictation. Any transcription errors are unintentional

## 2019-01-02 NOTE — Progress Notes (Signed)
[facility] [place] at [facility]   PATIENT NAME: Arthur [name]    MR#:  [id number]  DATE OF BIRTH:  [birth year]  SUBJECTIVE:  CHIEF COMPLAINT:   Chief Complaint  Patient presents with  . Fall  Patient seen and evaluated today Status post orthopedic surgery Tolerated procedure well Tolerating pain okay Pleasantly confused Has constipation  REVIEW OF SYSTEMS:    ROS  Demented Could not be obtained  DRUG ALLERGIES:  No Known Allergies  VITALS:  Blood pressure 127/78, pulse 91, temperature 98.3 F (36.8 C), temperature source Oral, resp. rate 18, height 6\' 1"  (1.854 m), weight 72.6 kg, SpO2 95 %.  PHYSICAL EXAMINATION:   Physical Exam  GENERAL:  73 y.o.-year-old patient lying in the bed with no acute distress.  EYES: Pupils equal, round, reactive to light and accommodation. No scleral icterus. Extraocular muscles intact.  HEENT: Head atraumatic, normocephalic. Oropharynx and nasopharynx clear.  NECK:  Supple, no jugular venous distention. No thyroid enlargement, no tenderness.  LUNGS: Normal breath sounds bilaterally, no wheezing, rales, rhonchi. No use of accessory muscles of respiration.  CARDIOVASCULAR: S1, S2 normal. No murmurs, rubs, or gallops.  ABDOMEN: Soft, nontender, nondistended. Bowel sounds present. No organomegaly or mass.  EXTREMITIES: No cyanosis, clubbing or edema b/l.    Bandaged left hip NEUROLOGIC: Cranial nerves II through XII are intact. No focal Motor or sensory deficits b/l.   PSYCHIATRIC: The patient is alert and oriented x 1 SKIN: No obvious rash, lesion, or ulcer.   LABORATORY PANEL:   CBC Recent Labs  Lab 01/01/19 0421  WBC 11.2*  HGB 9.7*  HCT 28.7*  PLT 238   ------------------------------------------------------------------------------------------------------------------ Chemistries  Recent Labs  Lab 01/02/19 0332  NA 138  K 4.1  CL 107  CO2 19*  GLUCOSE 221*  BUN 60*  CREATININE 4.63*  CALCIUM 8.1*    ------------------------------------------------------------------------------------------------------------------  Cardiac Enzymes No results for input(s): TROPONINI in the last 168 hours. ------------------------------------------------------------------------------------------------------------------  RADIOLOGY:  No results found.   ASSESSMENT AND PLAN:  73 year old male patient with history of type 2 diabetes mellitus, hypertension, Alzheimer's dementia presented to the emergency room for fall   -Left hip fracture Orthopedic surgery consult appreciated Status post ORIF procedure Physical therapy evaluation   -Acute kidney injury Worsened IV fluid hydration to continue Avoid nephrotoxic medication Monitor renal function Check renal ultrasound Nephrology consult  -UTI improving Klebsiella pneumonia UTI IV Rocephin antibiotic on board  -Dehydration improving IV fluids  -Left hip pain Pain management with oral tramadol and PRN morphine  -Alzheimer's dementia Supportive care One-on-one observation for patient safety  -Type 2 diabetes mellitus Diabetic diet Sliding scale coverage with insulin  -DVT prophylaxis  With subcu Lovenox daily 30 mg daily   All the records are reviewed and case discussed with Care Management/Social Worker. Management plans discussed with the patient, family and they are in agreement.  CODE STATUS: Full code  DVT Prophylaxis: SCDs and as per orthopedic recommendation  TOTAL TIME TAKING CARE OF THIS PATIENT: 39 minutes.   POSSIBLE D/C IN 2 to 3 DAYS, DEPENDING ON CLINICAL CONDITION.  Ihor Austin M.D on 01/02/2019 at 9:49 AM  Between 7am to 6pm - Pager - [phone]  After 6pm go to [email or web address] - password [email or web address]  [facility] [place] [facility]  Office  [phone]  CC: Primary care physician; Crummett, Lysle Rubens, NP  Note: This dictation was prepared with Dragon dictation along with smaller phrase technology.  Any transcriptional errors that result from this process are unintentional.

## 2019-01-02 NOTE — Progress Notes (Addendum)
OT Cancellation Note  Patient Details Name: Arthur [name] MRN: [id number] DOB: 06-Apr-1946   Cancelled Treatment:    Reason Eval/Treat Not Completed: Other (comment). Consult received, chart reviewed. Pt working with PT upon initial attempt this am. Will re-attempt OT evaluation at later date/time as pt is available and medically appropriate.   On 2nd attempt, pt alert, oriented to self, unable to follow commands or answer simple yes/no questions. Difficulty with sequencing familiar tasks (e.g., taking a sip from a cup). Pt required max cues to redirect to therapist. Pt unable to meaningfully participate this afternoon. Will re-attempt OT evaluation next date as appropriate.   Richrd Prime, MPH, MS, OTR/L ascom [phone] 01/02/19, 3:27 PM

## 2019-01-02 NOTE — TOC Progression Note (Signed)
Transition of Care [facility]) - Progression Note    Patient Details  Name: Arthur [name] MRN: [id number] Date of Birth: [birth year]  Transition of Care [facility]) CM/SW Contact  Barrie Dunker, RN Phone Number: 01/02/2019, 10:15 AM  Clinical Narrative:     Called and spoke with the daughter [name] to review bed offers, We reviewed the local offers first and she stated that she wanted to use [facility] due to having an uncle there  I notified Tresa Endo at [facility] that they are accepting the bed offer,Kelly will call me back with a room number  Expected Discharge Plan: Skilled Nursing Facility Barriers to Discharge: Continued Medical Work up  Expected Discharge Plan and Services Expected Discharge Plan: Skilled Nursing Facility   Discharge Planning Services: CM Consult   Living arrangements for the past 2 months: Single Family Home                                       Social Determinants of Health (SDOH) Interventions    Readmission Risk Interventions No flowsheet data found.

## 2019-01-02 NOTE — Progress Notes (Signed)
Inpatient Diabetes Program Recommendations  AACE/ADA: New Consensus Statement on Inpatient Glycemic Control   Target Ranges:  Prepandial:   less than 140 mg/dL      Peak postprandial:   less than 180 mg/dL (1-2 hours)      Critically ill patients:  140 - 180 mg/dL   Results for Arthur [name], Arthur [name] (MRN [id number]) as of 01/02/2019 09:18  Ref. Range 01/01/2019 07:48 01/01/2019 11:44 01/01/2019 17:24 01/02/2019 07:36  Glucose-Capillary Latest Ref Range: 70 - 99 mg/dL 768 (H) 088 (H) 110 (H) 220 (H)   Results for Arthur [name], Arthur [name] (MRN [id number]) as of 01/01/2019 12:49  Ref. Range 12/31/2018 07:24 12/31/2018 09:42 12/31/2018 10:40 12/31/2018 11:34 12/31/2018 16:44 12/31/2018 21:01  Glucose-Capillary Latest Ref Range: 70 - 99 mg/dL 292 (H) 446 (H) 286 (H) 212 (H) 102 (H) 204 (H)    Review of Glycemic Control  Diabetes history: DM2 Outpatient Diabetes medications: Levemir 15 units QPM, Metformin 850 mg BID, Amaryl 2 mg QAM Current orders for Inpatient glycemic control: Novolog 0-9 units TID with meals, Novolog 0-5 units QHS  Inpatient Diabetes Program Recommendations:   Insulin - Basal: Please consider ordering Levemir 5 units Q24H.  Thanks, Orlando Penner, RN, MSN, CDE Diabetes Coordinator Inpatient Diabetes Program [phone] (Team Pager from 8am to 5pm)

## 2019-01-02 NOTE — Care Management Important Message (Signed)
Important Message  Patient Details  Name: Arthur [name] MRN: [id number] Date of Birth: 08-Jan-1946   Medicare Important Message Given:  Yes    Olegario Messier A Maleeya Peterkin 01/02/2019, 10:50 AM

## 2019-01-02 NOTE — Progress Notes (Signed)
   Subjective: 2 Days Post-Op Procedure(s) (LRB): CANNULATED HIP PINNING (Left) Patient reports pain as mild.   Patient is well, and has had no acute complaints or problems Denies any CP, SOB, ABD pain. We will continue therapy today.    Objective: Vital signs in last 24 hours: Temp:  [98 F (36.7 C)-98.3 F (36.8 C)] 98 F (36.7 C) (06/02 0524) Pulse Rate:  [101-108] 101 (06/02 0524) Resp:  [16-18] 16 (06/02 0524) BP: (116-145)/(67-88) 145/87 (06/02 0524) SpO2:  [92 %-97 %] 97 % (06/02 0524)  Intake/Output from previous day: 06/01 0701 - 06/02 0700 In: 1906.3 [P.O.:120; I.V.:1686.3; IV Piggyback:100] Out: 200 [Urine:200] Intake/Output this shift: No intake/output data recorded.  Recent Labs    12/30/18 0923 12/31/18 0502 01/01/19 0421  HGB 12.4* 12.2* 9.7*   Recent Labs    12/31/18 0502 01/01/19 0421  WBC 19.1* 11.2*  RBC 3.98* 3.17*  HCT 35.6* 28.7*  PLT 270 238   Recent Labs    01/01/19 0421 01/02/19 0332  NA 138 138  K 3.8 4.1  CL 106 107  CO2 20* 19*  BUN 44* 60*  CREATININE 3.26* 4.63*  GLUCOSE 177* 221*  CALCIUM 8.2* 8.1*   Recent Labs    12/30/18 0923  INR 1.1    EXAM General - Patient is Alert, Appropriate and Oriented Extremity - Neurovascular intact Sensation intact distally Intact pulses distally No cellulitis present Compartment soft Dressing - dressing C/D/I and scant drainage Motor Function - intact, moving foot and toes well on exam.   Past Medical History:  Diagnosis Date  . Alzheimer disease (HCC)   . Diabetes (HCC)   . Hypertension     Assessment/Plan:   2 Days Post-Op Procedure(s) (LRB): CANNULATED HIP PINNING (Left) Active Problems:   Hip fracture (HCC)  Estimated body mass index is 21.11 kg/m as calculated from the following:   Height as of this encounter: 6\' 1"  (1.854 m).   Weight as of this encounter: 72.6 kg. Advance diet Up with therapy  Needs BM Acute post op blood loss anemia - Hgb pending this  am CM to assist with discharge   DVT Prophylaxis - Lovenox, TED hose and SCDs Weight-Bearing as tolerated to left leg   T. Cranston Neighbor, PA-C [facility] 01/02/2019, 7:22 AM

## 2019-01-02 NOTE — Progress Notes (Signed)
Physical Therapy Treatment Patient Details Name: Arthur [name] MRN: [id number] DOB: 1946-03-08 Today's Date: 01/02/2019    History of Present Illness 73yo male who comes [facility] on 5/27 after found on floor by family. Pt noted to have Left hip fracture, presents now s/p ORIF IM nailing of Left hip WBAT. PMH: Alzheimer's dementia, DM2, HTN, hemorrhagic CVA, Lt ankle fusion.     PT Comments    Pt continues to need excessive cuing, guidance and encouragement t/o the session.  He was ostensibly willing and eager to participate with PT but pain and anxiety required extra time, cuing and reinforcement to get even basic exercises and mobility addressed.  He clearly had some confusion but did show effort at times when all factors were aligned.  Pt unable to get to standing despite heavy assist on 3 attempts to raised bed, extra cuing/set up, etc.    Follow Up Recommendations  SNF;Supervision/Assistance - 24 hour;Supervision for mobility/OOB     Equipment Recommendations       Recommendations for Other Services       Precautions / Restrictions Precautions Precautions: Fall Restrictions Weight Bearing Restrictions: Yes LLE Weight Bearing: Weight bearing as tolerated    Mobility  Bed Mobility Overal bed mobility: Needs Assistance Bed Mobility: Supine to Sit     Supine to sit: Max assist;Mod assist     General bed mobility comments: Pt able to show some effort but between confusion, pain and anxiety did very little to actually assist getting to sitting  Transfers Overall transfer level: Needs assistance Equipment used: Rolling walker (2 wheeled) Transfers: Sit to/from Stand Sit to Stand: Total assist;Max assist         General transfer comment: Similarly to yesterday on eval pt was unable to ever attain fully upright. He was hesitant/anxious, showed no ability to get hips forward and ultimately did not attain even partial upright on at least 3 standing attempts at EOB efforts.   Hips staying well behind BOS and despite excessive assist to try and get hips forward and attain full standing  Ambulation/Gait             General Gait Details: unable to attain standing, not appropriate to ambulation attempts   Stairs             Wheelchair Mobility    Modified Rankin (Stroke Patients Only)       Balance Overall balance assessment: Needs assistance   Sitting balance-Leahy Scale: Fair Sitting balance - Comments: Pt with pain and anxiousness t/o the effort.  Showed poor safety awareness, able to only briefly maintain sitting w/o assist interittently.                                    Cognition Arousal/Alertness: Awake/alert Behavior During Therapy: Restless(difficulty with follow through on thoughts, vague confusion ) Overall Cognitive Status: No family/caregiver present to determine baseline cognitive functioning                                        Exercises General Exercises - Lower Extremity Ankle Circles/Pumps: AROM;10 reps Quad Sets: AROM;10 reps;AAROM(pt needing excessive cuing and regular tactile assist) Short Arc Quad: AAROM;10 reps(pt with poor pain tolerance get to position and doing ex) Heel Slides: PROM;AAROM;10 reps Hip ABduction/ADduction: AAROM;10 reps    General Comments  Pertinent Vitals/Pain Pain Assessment: Faces Faces Pain Scale: Hurts whole lot Pain Location: Left leg/hip    Home Living                      Prior Function            PT Goals (current goals can now be found in the care plan section) Progress towards PT goals: Progressing toward goals    Frequency    7X/week      PT Plan Current plan remains appropriate    Co-evaluation              AM-PAC PT "6 Clicks" Mobility   Outcome Measure  Help needed turning from your back to your side while in a flat bed without using bedrails?: A Lot Help needed moving from lying on your back to  sitting on the side of a flat bed without using bedrails?: Total Help needed moving to and from a bed to a chair (including a wheelchair)?: Total Help needed standing up from a chair using your arms (e.g., wheelchair or bedside chair)?: Total Help needed to walk in hospital room?: Total Help needed climbing 3-5 steps with a railing? : Total 6 Click Score: 7    End of Session Equipment Utilized During Treatment: Gait belt Activity Tolerance: Patient tolerated treatment well;Patient limited by fatigue;Patient limited by pain;Other (comment)(anxious with mobility) Patient left: in bed;with call bell/phone within reach;with SCD's reapplied;with nursing/sitter in room   PT Visit Diagnosis: Unsteadiness on feet (R26.81);Other abnormalities of gait and mobility (R26.89);Difficulty in walking, not elsewhere classified (R26.2)     Time: [id number] PT Time Calculation (min) (ACUTE ONLY): 39 min  Charges:  $Therapeutic Exercise: 8-22 mins $Therapeutic Activity: 23-37 mins                     Malachi ProGalen R Bama Hanselman, DPT 01/02/2019, 3:28 PM

## 2019-01-02 NOTE — TOC Progression Note (Signed)
Transition of Care [facility]) - Progression Note    Patient Details  Name: Arthur [name] MRN: [id number] Date of Birth: 1945-09-23  Transition of Care [facility]) CM/SW Contact  Barrie Dunker, RN Phone Number: 01/02/2019, 10:46 AM  Clinical Narrative:    Notified Dr. Tobi Bastos that if the patient does not Discharge today the SNF they are going to requires another Covid test, the test has to be within 3 days of DC   Expected Discharge Plan: Skilled Nursing Facility Barriers to Discharge: Continued Medical Work up  Expected Discharge Plan and Services Expected Discharge Plan: Skilled Nursing Facility   Discharge Planning Services: CM Consult   Living arrangements for the past 2 months: Single Family Home                                       Social Determinants of Health (SDOH) Interventions    Readmission Risk Interventions No flowsheet data found.

## 2019-01-03 LAB — BASIC METABOLIC PANEL
Anion gap: 8 (ref 5–15)
BUN: 44 mg/dL — ABNORMAL HIGH (ref 8–23)
CO2: 21 mmol/L — ABNORMAL LOW (ref 22–32)
Calcium: 8.2 mg/dL — ABNORMAL LOW (ref 8.9–10.3)
Chloride: 111 mmol/L (ref 98–111)
Creatinine, Ser: 2.37 mg/dL — ABNORMAL HIGH (ref 0.61–1.24)
GFR calc Af Amer: 30 mL/min — ABNORMAL LOW (ref >60)
GFR calc non Af Amer: 26 mL/min — ABNORMAL LOW (ref >60)
Glucose, Bld: 145 mg/dL — ABNORMAL HIGH (ref 70–99)
Potassium: 3.5 mmol/L (ref 3.5–5.1)
Sodium: 140 mmol/L (ref 135–145)

## 2019-01-03 LAB — GLUCOSE, CAPILLARY
Glucose-Capillary: 166 mg/dL — ABNORMAL HIGH (ref 70–99)
Glucose-Capillary: 194 mg/dL — ABNORMAL HIGH (ref 70–99)
Glucose-Capillary: 162 mg/dL — ABNORMAL HIGH (ref 70–99)
Glucose-Capillary: 139 mg/dL — ABNORMAL HIGH (ref 70–99)

## 2019-01-03 LAB — CBC
HCT: 29.4 % — ABNORMAL LOW (ref 39.0–52.0)
Hemoglobin: 9.8 g/dL — ABNORMAL LOW (ref 13.0–17.0)
MCH: 30.1 pg (ref 26.0–34.0)
MCHC: 33.3 g/dL (ref 30.0–36.0)
MCV: 90.2 fL (ref 80.0–100.0)
Platelets: 233 10*3/uL (ref 150–400)
RBC: 3.26 MIL/uL — ABNORMAL LOW (ref 4.22–5.81)
RDW: 13.3 % (ref 11.5–15.5)
WBC: 10.6 10*3/uL — ABNORMAL HIGH (ref 4.0–10.5)
nRBC: 0 % (ref 0.0–0.2)

## 2019-01-03 MED ORDER — ENOXAPARIN SODIUM 30 MG/0.3ML ~~LOC~~ SOLN: 30.0000 mg | SUBCUTANEOUS | 0 refills | Status: DC

## 2019-01-03 MED ORDER — HYDROCODONE-ACETAMINOPHEN 5-325 MG PO TABS: 1.0000 | ORAL_TABLET | ORAL | 0 refills | Status: DC | PRN

## 2019-01-03 NOTE — Progress Notes (Signed)
[facility] [place], [place] 01/03/19  Subjective:   Foley catheter was inserted yesterday Urine output of 3900 cc overnight Serum creatinine has improved to 2.37 today Patient is not much interactive and did not answer any questions According to nurse, ate his breakfast  Objective:  Vital signs in last 24 hours:  Temp:  [98.4 F (36.9 C)-98.6 F (37 C)] 98.5 F (36.9 C) (06/03 0727) Pulse Rate:  [80-91] 80 (06/03 0727) Resp:  [17-18] 17 (06/03 0035) BP: (108-118)/(64-79) 118/64 (06/03 0727) SpO2:  [94 %-97 %] 94 % (06/03 0727)  Weight change:  Filed Weights   12/30/18 0919  Weight: 72.6 kg    Intake/Output:    Intake/Output Summary (Last 24 hours) at 01/03/2019 1505 Last data filed at 01/03/2019 1418 Gross per 24 hour  Intake 240 ml  Output 4350 ml  Net -4110 ml    Physical Exam: General:  NAD, Laying in bed  HEENT Anicteric, moist oral mucus membranes  Neck:  supple  Lungs: Normal effort on room air, clear  Heart::  regular  Abdomen: Soft, tense suprapubic area  Extremities:  no edema, SCDs in place  Neurologic: Alert, withdrawn, did not answer questions  Skin: warm  Foley: To be placed      Basic Metabolic Panel:  Recent Labs  Lab 12/30/18 0923 12/31/18 0502 01/01/19 0421 01/02/19 0332 01/03/19 0313  NA 135 135 138 138 140  K 3.7 4.2 3.8 4.1 3.5  CL 99 99 106 107 111  CO2 22 22 20* 19* 21*  GLUCOSE 279* 399* 177* 221* 145*  BUN 28* 29* 44* 60* 44*  CREATININE 1.09 1.70* 3.26* 4.63* 2.37*  CALCIUM 9.2 8.9 8.2* 8.1* 8.2*     CBC: Recent Labs  Lab 12/30/18 0923 12/31/18 0502 01/01/19 0421 01/03/19 0313  WBC 20.3* 19.1* 11.2* 10.6*  HGB 12.4* 12.2* 9.7* 9.8*  HCT 36.0* 35.6* 28.7* 29.4*  MCV 88.9 89.4 90.5 90.2  PLT 279 270 238 233     No results found for: HEPBSAG, HEPBSAB, HEPBIGM    Microbiology:  Recent Results (from the past 240 hour(s))  Urine Culture     Status: Abnormal   Collection Time: 12/30/18  12:07 PM  Result Value Ref Range Status   Specimen Description   Final    URINE, RANDOM Performed at [facility], [address], [place], [place] [address]    Special Requests   Final    Normal Performed at [facility], [address] [place], [place] [address]    Culture >=100,000 COLONIES/mL KLEBSIELLA PNEUMONIAE (A)  Final   Report Status 01/01/2019 FINAL  Final   Organism ID, Bacteria KLEBSIELLA PNEUMONIAE (A)  Final      Susceptibility   Klebsiella pneumoniae - MIC*    AMPICILLIN >=32 RESISTANT Resistant     CEFAZOLIN <=4 SENSITIVE Sensitive     CEFTRIAXONE <=1 SENSITIVE Sensitive     CIPROFLOXACIN <=0.25 SENSITIVE Sensitive     GENTAMICIN <=1 SENSITIVE Sensitive     IMIPENEM <=0.25 SENSITIVE Sensitive     NITROFURANTOIN 128 RESISTANT Resistant     TRIMETH/SULFA <=20 SENSITIVE Sensitive     AMPICILLIN/SULBACTAM 8 SENSITIVE Sensitive     PIP/TAZO <=4 SENSITIVE Sensitive     Extended ESBL NEGATIVE Sensitive     * >=100,000 COLONIES/mL KLEBSIELLA PNEUMONIAE  SARS Coronavirus 2 (CEPHEID - Performed in [facility] hospital lab), Hosp Order     Status: None   Collection Time: 12/30/18 12:07 PM  Result Value  Ref Range Status   SARS Coronavirus 2 NEGATIVE NEGATIVE Final    Comment: (NOTE) If result is NEGATIVE SARS-CoV-2 target nucleic acids are NOT DETECTED. The SARS-CoV-2 RNA is generally detectable in upper and lower  respiratory specimens during the acute phase of infection. The lowest  concentration of SARS-CoV-2 viral copies this assay can detect is 250  copies / mL. A negative result does not preclude SARS-CoV-2 infection  and should not be used as the sole basis for treatment or other  patient management decisions.  A negative result may occur with  improper specimen collection / handling, submission of specimen other  than nasopharyngeal swab, presence of viral mutation(s) within the  areas targeted by this assay, and inadequate number of viral  copies  (<250 copies / mL). A negative result must be combined with clinical  observations, patient history, and epidemiological information. If result is POSITIVE SARS-CoV-2 target nucleic acids are DETECTED. The SARS-CoV-2 RNA is generally detectable in upper and lower  respiratory specimens dur ing the acute phase of infection.  Positive  results are indicative of active infection with SARS-CoV-2.  Clinical  correlation with patient history and other diagnostic information is  necessary to determine patient infection status.  Positive results do  not rule out bacterial infection or co-infection with other viruses. If result is PRESUMPTIVE POSTIVE SARS-CoV-2 nucleic acids MAY BE PRESENT.   A presumptive positive result was obtained on the submitted specimen  and confirmed on repeat testing.  While 2019 novel coronavirus  (SARS-CoV-2) nucleic acids may be present in the submitted sample  additional confirmatory testing may be necessary for epidemiological  and / or clinical management purposes  to differentiate between  SARS-CoV-2 and other Sarbecovirus currently known to infect humans.  If clinically indicated additional testing with an alternate test  methodology [phone]) is advised. The SARS-CoV-2 RNA is generally  detectable in upper and lower respiratory sp ecimens during the acute  phase of infection. The expected result is Negative. Fact Sheet for Patients:  BoilerBrush.com.cy Fact Sheet for Healthcare Providers: https://pope.com/ This test is not yet approved or cleared by the [place] FDA and has been authorized for detection and/or diagnosis of SARS-CoV-2 by FDA under an Emergency Use Authorization (EUA).  This EUA will remain in effect (meaning this test can be used) for the duration of the COVID-19 declaration under Section 564(b)(1) of the Act, 21 U.S.C. section 360bbb-3(b)(1), unless the authorization is terminated  or revoked sooner. Performed at [facility], [address] [place], [place] [address]   Surgical PCR screen     Status: None   Collection Time: 12/30/18  5:55 PM  Result Value Ref Range Status   MRSA, PCR NEGATIVE NEGATIVE Final   Staphylococcus aureus NEGATIVE NEGATIVE Final    Comment: (NOTE) The Xpert SA Assay (FDA approved for NASAL specimens in patients 16 years of age and older), is one component of a comprehensive surveillance program. It is not intended to diagnose infection nor to guide or monitor treatment. Performed at [facility], [address] [place], [place] [address]     Coagulation Studies: No results for input(s): LABPROT, INR in the last 72 hours.  Urinalysis: No results for input(s): COLORURINE, LABSPEC, PHURINE, GLUCOSEU, HGBUR, BILIRUBINUR, KETONESUR, PROTEINUR, UROBILINOGEN, NITRITE, LEUKOCYTESUR in the last 72 hours.  Invalid input(s): APPERANCEUR    Imaging: US Renal  Result Date: 01/02/2019 CLINICAL DATA:  Acute renal failure. EXAM: RENAL / URINARY TRACT ULTRASOUND COMPLETE COMPARISON:  Limited correlation made  with a chest CT 01/05/2018. FINDINGS: Right Kidney: Renal measurements: 11.8 x 5.4 x 5.5 cm = volume: 185 mL. Mild-to-moderate hydronephrosis. No significant cortical thinning or focal lesion identified. Left Kidney: Renal measurements: 10.3 x 5.4 x 4.4 cm = volume: 127 mL. Mild-to-moderate hydronephrosis. Mild cortical thinning. Bladder: The bladder is distended with dependent debris. Pre voiding bladder volume approximately 1448 cc. Patient unable to void during the examination. IMPRESSION: 1. Mild-to-moderate bilateral hydronephrosis, nonspecific in etiology given the distended bladder. This could be due to bladder outlet obstruction. Repeat ultrasound after bladder emptying or Foley catheter placement could be performed to exclude ureteral obstruction. 2. Debris in the bladder. 3. Mild renal cortical thinning on the left.  Electronically Signed   By: Carey BullocksWilliam  Veazey M.D.   On: 01/02/2019 11:17     Medications:   . sodium chloride    . sodium chloride 100 mL/hr at 01/03/19 0856   . atorvastatin  10 mg Oral q1800  . cephALEXin  250 mg Oral Q12H  . docusate sodium  100 mg Oral BID  . enoxaparin (LOVENOX) injection  30 mg Subcutaneous Q24H  . insulin aspart  0-5 Units Subcutaneous QHS  . insulin aspart  0-9 Units Subcutaneous TID WC  . insulin detemir  5 Units Subcutaneous QHS  . polyethylene glycol  17 g Oral Daily  . sertraline  100 mg Oral Daily  . sodium chloride flush  3 mL Intravenous Q12H  . tamsulosin  0.4 mg Oral QPC supper  . vitamin B-12  1,000 mcg Oral Daily   sodium chloride, acetaminophen **OR** acetaminophen, alum & mag hydroxide-simeth, bisacodyl, HYDROcodone-acetaminophen, magnesium citrate, menthol-cetylpyridinium **OR** phenol, metoCLOPramide **OR** metoCLOPramide (REGLAN) injection, morphine injection, ondansetron **OR** ondansetron (ZOFRAN) IV, prochlorperazine, QUEtiapine, senna-docusate, sodium chloride flush, traMADol  Assessment/ Plan:  73 y.o. male with medical problems of dementia, DM, HTN, h/o Left Tibiofibular fusion, h/o hemorrhagic stroke , was admitted on 12/30/2018 with Left Hip Fracture. Underwent ORIF 5/31   1. 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 Klebsiella UTI - currently on iv Rocephin   LOS: 4 Brealyn Baril 6/3/20203:05 PM  [facility] [place], [place] [phone]  Note: This note was prepared with Dragon dictation. Any transcription errors are unintentional

## 2019-01-03 NOTE — Progress Notes (Signed)
   Subjective: 3 Days Post-Op Procedure(s) (LRB): CANNULATED HIP PINNING (Left) Patient reports pain as mild.   Patient is well, and has had no acute complaints or problems Denies any CP, SOB, ABD pain. We will continue therapy today.    Objective: Vital signs in last 24 hours: Temp:  [98.4 F (36.9 C)-98.6 F (37 C)] 98.5 F (36.9 C) (06/03 0727) Pulse Rate:  [80-91] 80 (06/03 0727) Resp:  [17-18] 17 (06/03 0035) BP: (108-118)/(64-79) 118/64 (06/03 0727) SpO2:  [94 %-97 %] 94 % (06/03 0727)  Intake/Output from previous day: 06/02 0701 - 06/03 0700 In: 2205.5 [P.O.:240; I.V.:1865.5; IV Piggyback:100] Out: 3900 [Urine:3900] Intake/Output this shift: No intake/output data recorded.  Recent Labs    01/01/19 0421 01/03/19 0313  HGB 9.7* 9.8*   Recent Labs    01/01/19 0421 01/03/19 0313  WBC 11.2* 10.6*  RBC 3.17* 3.26*  HCT 28.7* 29.4*  PLT 238 233   Recent Labs    01/02/19 0332 01/03/19 0313  NA 138 140  K 4.1 3.5  CL 107 111  CO2 19* 21*  BUN 60* 44*  CREATININE 4.63* 2.37*  GLUCOSE 221* 145*  CALCIUM 8.1* 8.2*   No results for input(s): LABPT, INR in the last 72 hours.  EXAM General - Patient is Alert, Appropriate and Oriented Extremity - Neurovascular intact Sensation intact distally Intact pulses distally No cellulitis present Compartment soft Dressing - dressing C/D/I and scant drainage Motor Function - intact, moving foot and toes well on exam.   Past Medical History:  Diagnosis Date  . Alzheimer disease (HCC)   . Diabetes (HCC)   . Hypertension     Assessment/Plan:   3 Days Post-Op Procedure(s) (LRB): CANNULATED HIP PINNING (Left) Active Problems:   Hip fracture (HCC)  Estimated body mass index is 21.11 kg/m as calculated from the following:   Height as of this encounter: 6\' 1"  (1.854 m).   Weight as of this encounter: 72.6 kg. Advance diet Up with therapy  Acute post op blood loss anemia - Hgb stable. CM to assist with  discharge   Skilled nursing facility to remove staples on 01/15/2019 Follow-up with [facility] orthopedics in 6 weeks Lovenox 40 mg subcu daily x14 days TED hose bilateral lower extremities x6 weeks   DVT Prophylaxis - Lovenox, TED hose and SCDs Weight-Bearing as tolerated to left leg   T. Cranston Neighbor, PA-C [facility] 01/03/2019, 8:12 AM

## 2019-01-03 NOTE — Progress Notes (Signed)
OT Cancellation Note  Patient Details Name: Arthur [name] MRN: [id number] DOB: [birth year]   Cancelled Treatment:    Reason Eval/Treat Not Completed: Other (comment). Pt eating breakfast and just had pain meds, per RN. Will re-attempt at later time to allow for optimal pain mgt for participation.   Richrd Prime, MPH, MS, OTR/L ascom [phone] 01/03/19, 9:27 AM

## 2019-01-03 NOTE — Progress Notes (Signed)
Physical Therapy Treatment Patient Details Name: Arthur [name] MRN: [id number] DOB: July 04, 1946 Today's Date: 01/03/2019    History of Present Illness 73yo male who comes to [facility] on 5/27 after found on floor by family. Pt noted to have Left hip fracture, presents now s/p ORIF IM nailing of Left hip WBAT. PMH: Alzheimer's dementia, DM2, HTN, hemorrhagic CVA, Lt ankle fusion.     PT Comments    Pt requiring extra time and cueing for participation with therapy activities.  Pt often stating "wait a minute" but appearing to be d/t L hip/thigh pain with attempting movement/mobility.  Able to stand 3x's today with min to mod assist x2 and walker use but pt maintaining upright with B LE's supported against bed.  Unable to get pt to initiate stepping in standing (pt appearing with decreased WB'ing through L LE compared to R LE).  Pt noted with small bowel incontinence on sheets (NT notified and came to assist pt with clean up end of session).  Will continue to focus on strengthening and progressive mobility per pt tolerance.   Follow Up Recommendations  SNF     Equipment Recommendations  Rolling walker with 5" wheels;3in1 (PT);Wheelchair (measurements PT);Wheelchair cushion (measurements PT)    Recommendations for Other Services       Precautions / Restrictions Precautions Precautions: Fall Restrictions Weight Bearing Restrictions: Yes LLE Weight Bearing: Weight bearing as tolerated    Mobility  Bed Mobility Overal bed mobility: Needs Assistance Bed Mobility: Supine to Sit;Sit to Supine;Rolling Rolling: Max assist(logrolling to R in bed for clean-up with NT; max vc's to assist)   Supine to sit: Max assist;+2 for safety/equipment;HOB elevated Sit to supine: +2 for physical assistance;HOB elevated   General bed mobility comments: assist for trunk and B LE's; 2nd assist for safety  Transfers Overall transfer level: Needs assistance Equipment used: Rolling walker (2 wheeled) Transfers:  Sit to/from Stand Sit to Stand: Min assist;Mod assist;+2 physical assistance         General transfer comment: min to mod assist x2 to stand x3 trials from bed; vc's and tactile cues for UE and LE placement; assist to initiate and come to full stand; pt pushing B LE's into bed to assist with standing once upright  Ambulation/Gait             General Gait Details: unable to get pt to attempt stepping with max demo and vc's   Stairs             Wheelchair Mobility    Modified Rankin (Stroke Patients Only)       Balance Overall balance assessment: Needs assistance Sitting-balance support: No upper extremity supported;Feet supported Sitting balance-Leahy Scale: Poor Sitting balance - Comments: pt requiring at least single UE support for static sitting balance; leaning to R at times d/t L hip/thigh pain     Standing balance-Leahy Scale: Poor Standing balance comment: pt pushing B LE's against bed to maintain upright balance; vc's and tactile cues for upright posture although minimal correction noted                            Cognition Arousal/Alertness: Awake/alert Behavior During Therapy: Impulsive(confusion noted) Overall Cognitive Status: No family/caregiver present to determine baseline cognitive functioning                                 General Comments: Oriented to  person      Exercises Total Joint Exercises Heel Slides: AAROM;Strengthening;Left;10 reps;Supine Hip ABduction/ADduction: AAROM;Strengthening;Left;10 reps;Supine    General Comments   Nursing cleared pt for participation in physical therapy.  Pt agreeable to PT session.      Pertinent Vitals/Pain Pain Assessment: Faces Faces Pain Scale: Hurts a little bit(at rest) Pain Location: Left thigh/hip Pain Descriptors / Indicators: Operative site guarding;Grimacing;Guarding Pain Intervention(s): Limited activity within patient's tolerance;Monitored during  session;Premedicated before session;Repositioned    Home Living                      Prior Function            PT Goals (current goals can now be found in the care plan section) Acute Rehab PT Goals PT Goal Formulation: Patient unable to participate in goal setting Time For Goal Achievement: 01/15/19 Progress towards PT goals: Progressing toward goals    Frequency    7X/week      PT Plan Current plan remains appropriate    Co-evaluation              AM-PAC PT "6 Clicks" Mobility   Outcome Measure  Help needed turning from your back to your side while in a flat bed without using bedrails?: A Lot Help needed moving from lying on your back to sitting on the side of a flat bed without using bedrails?: Total Help needed moving to and from a bed to a chair (including a wheelchair)?: Total Help needed standing up from a chair using your arms (e.g., wheelchair or bedside chair)?: Total Help needed to walk in hospital room?: Total Help needed climbing 3-5 steps with a railing? : Total 6 Click Score: 7    End of Session Equipment Utilized During Treatment: Gait belt Activity Tolerance: Patient limited by pain Patient left: in bed;with call bell/phone within reach;with bed alarm set;with SCD's reapplied;Other (comment)(B heels elevated via pillow; bed in lowest position) Nurse Communication: Mobility status PT Visit Diagnosis: Unsteadiness on feet (R26.81);Other abnormalities of gait and mobility (R26.89);Difficulty in walking, not elsewhere classified (R26.2)     Time: [id number] PT Time Calculation (min) (ACUTE ONLY): 40 min  Charges:  $Therapeutic Exercise: 8-22 mins $Therapeutic Activity: 23-37 mins                     Hendricks LimesEmily Dylon Correa, PT 01/03/19, 11:47 AM [id number]

## 2019-01-03 NOTE — Progress Notes (Signed)
[facility] [place] [facility]   PATIENT NAME: Arthur [name]    MR#:  [id number]  DATE OF BIRTH:  04/17/1946  SUBJECTIVE:  CHIEF COMPLAINT:   Chief Complaint  Patient presents with  . Fall  Patient seen and evaluated today Currently on Foley catheter for urinary obstruction Renal ultrasound reviewed Diuresing well Tolerated procedure well Tolerating pain okay Pleasantly confused  REVIEW OF SYSTEMS:    ROS  Demented Could not be obtained  DRUG ALLERGIES:  No Known Allergies  VITALS:  Blood pressure 118/64, pulse 80, temperature 98.5 F (36.9 C), temperature source Oral, resp. rate 17, height 6\' 1"  (1.854 m), weight 72.6 kg, SpO2 94 %.  PHYSICAL EXAMINATION:   Physical Exam  GENERAL:  73 y.o.-year-old patient lying in the bed with no acute distress.  EYES: Pupils equal, round, reactive to light and accommodation. No scleral icterus. Extraocular muscles intact.  HEENT: Head atraumatic, normocephalic. Oropharynx and nasopharynx clear.  NECK:  Supple, no jugular venous distention. No thyroid enlargement, no tenderness.  LUNGS: Normal breath sounds bilaterally, no wheezing, rales, rhonchi. No use of accessory muscles of respiration.  CARDIOVASCULAR: S1, S2 normal. No murmurs, rubs, or gallops.  ABDOMEN: Soft, nontender, nondistended. Bowel sounds present. No organomegaly or mass.  Has Foley catheter EXTREMITIES: No cyanosis, clubbing or edema b/l.    Bandaged left hip NEUROLOGIC: Cranial nerves II through XII are intact. No focal Motor or sensory deficits b/l.   PSYCHIATRIC: The patient is alert and oriented x 1 SKIN: No obvious rash, lesion, or ulcer.   LABORATORY PANEL:   CBC Recent Labs  Lab 01/03/19 0313  WBC 10.6*  HGB 9.8*  HCT 29.4*  PLT 233   ------------------------------------------------------------------------------------------------------------------ Chemistries  Recent Labs  Lab 01/03/19 0313  NA 140  K 3.5  CL 111   CO2 21*  GLUCOSE 145*  BUN 44*  CREATININE 2.37*  CALCIUM 8.2*   ------------------------------------------------------------------------------------------------------------------  Cardiac Enzymes No results for input(s): TROPONINI in the last 168 hours. ------------------------------------------------------------------------------------------------------------------  RADIOLOGY:  [place] Renal  Result Date: 01/02/2019 CLINICAL DATA:  Acute renal failure. EXAM: RENAL / URINARY TRACT ULTRASOUND COMPLETE COMPARISON:  Limited correlation made with a chest CT 01/05/2018. FINDINGS: Right Kidney: Renal measurements: 11.8 x 5.4 x 5.5 cm = volume: 185 mL. Mild-to-moderate hydronephrosis. No significant cortical thinning or focal lesion identified. Left Kidney: Renal measurements: 10.3 x 5.4 x 4.4 cm = volume: 127 mL. Mild-to-moderate hydronephrosis. Mild cortical thinning. Bladder: The bladder is distended with dependent debris. Pre voiding bladder volume approximately 1448 cc. Patient unable to void during the examination. IMPRESSION: 1. Mild-to-moderate bilateral hydronephrosis, nonspecific in etiology given the distended bladder. This could be due to bladder outlet obstruction. Repeat ultrasound after bladder emptying or Foley catheter placement could be performed to exclude ureteral obstruction. 2. Debris in the bladder. 3. Mild renal cortical thinning on the left. Electronically Signed   By: Carey BullocksWilliam  Veazey M.D.   On: 01/02/2019 11:17     ASSESSMENT AND PLAN:  73 year old male patient with history of type 2 diabetes mellitus, hypertension, Alzheimer's dementia presented to the emergency room for fall   -Left hip fracture Orthopedic surgery consult appreciated Status post ORIF procedure Physical therapy evaluation   -Acute kidney injury improving Creatinine improving IV fluid hydration to continue Avoid nephrotoxic medication Monitor renal function Nephrology consult appreciated  -Moderate  hydronephrosis seen Secondary to bladder outlet obstruction Has Foley catheter Currently started on Flomax  -UTI improving Klebsiella pneumonia UTI Continue oral cephalexin antibiotic  -  Dehydration improved IV fluids  -Left hip pain Pain management with oral tramadol and PRN morphine  -Alzheimer's dementia Supportive care One-on-one observation for patient safety  -Type 2 diabetes mellitus Diabetic diet Sliding scale coverage with insulin  -DVT prophylaxis  With subcu Lovenox daily 30 mg daily   All the records are reviewed and case discussed with Care Management/Social Worker. Management plans discussed with the patient, family and they are in agreement.  CODE STATUS: Full code  DVT Prophylaxis: SCDs and as per orthopedic recommendation  TOTAL TIME TAKING CARE OF THIS PATIENT: 35 minutes.   POSSIBLE D/C IN 1 to 2 DAYS, DEPENDING ON CLINICAL CONDITION.  Ihor Austin M.D on 01/03/2019 at 10:35 AM  Between 7am to 6pm - Pager - [phone]  After 6pm go to [email or web address] - password [email or web address]  [facility] [place] [facility]  Office  [phone]  CC: Primary care physician; Crummett, Lysle Rubens, NP  Note: This dictation was prepared with Dragon dictation along with smaller phrase technology. Any transcriptional errors that result from this process are unintentional.

## 2019-01-04 LAB — GLUCOSE, CAPILLARY
Glucose-Capillary: 120 mg/dL — ABNORMAL HIGH (ref 70–99)
Glucose-Capillary: 106 mg/dL — ABNORMAL HIGH (ref 70–99)
Glucose-Capillary: 248 mg/dL — ABNORMAL HIGH (ref 70–99)

## 2019-01-04 LAB — BASIC METABOLIC PANEL
Anion gap: 11 (ref 5–15)
BUN: 24 mg/dL — ABNORMAL HIGH (ref 8–23)
CO2: 22 mmol/L (ref 22–32)
Calcium: 8.3 mg/dL — ABNORMAL LOW (ref 8.9–10.3)
Chloride: 106 mmol/L (ref 98–111)
Creatinine, Ser: 1.07 mg/dL (ref 0.61–1.24)
GFR calc Af Amer: >60 mL/min (ref >60)
GFR calc non Af Amer: >60 mL/min (ref >60)
Glucose, Bld: 120 mg/dL — ABNORMAL HIGH (ref 70–99)
Potassium: 3.3 mmol/L — ABNORMAL LOW (ref 3.5–5.1)
Sodium: 139 mmol/L (ref 135–145)

## 2019-01-04 LAB — SARS CORONAVIRUS 2 BY RT PCR (HOSPITAL ORDER, PERFORMED IN ~~LOC~~ HOSPITAL LAB): SARS Coronavirus 2: NEGATIVE

## 2019-01-04 LAB — MAGNESIUM: Magnesium: 1.8 mg/dL (ref 1.7–2.4)

## 2019-01-04 MED ORDER — ENOXAPARIN SODIUM 40 MG/0.4ML ~~LOC~~ SOLN
40.0000 mg | SUBCUTANEOUS | Status: DC
  Administered 2019-01-04: 40 mg via SUBCUTANEOUS
  Filled 2019-01-04: qty 0.4

## 2019-01-04 MED ORDER — TAMSULOSIN HCL 0.4 MG PO CAPS: 0.4000 mg | ORAL_CAPSULE | Freq: Every day | ORAL | Status: AC

## 2019-01-04 MED ORDER — CEPHALEXIN 250 MG PO CAPS: 250.0000 mg | ORAL_CAPSULE | Freq: Two times a day (BID) | ORAL | Status: DC

## 2019-01-04 MED ORDER — BISACODYL 10 MG RE SUPP: 10.0000 mg | Freq: Every day | RECTAL | 0 refills | Status: AC | PRN

## 2019-01-04 MED ORDER — CEPHALEXIN 500 MG PO CAPS: 500.0000 mg | ORAL_CAPSULE | Freq: Four times a day (QID) | ORAL | 0 refills | Status: AC

## 2019-01-04 MED ORDER — SENNOSIDES-DOCUSATE SODIUM 8.6-50 MG PO TABS: 1.0000 | ORAL_TABLET | Freq: Every evening | ORAL | Status: AC | PRN

## 2019-01-04 MED ORDER — POTASSIUM CHLORIDE CRYS ER 20 MEQ PO TBCR
40.0000 meq | EXTENDED_RELEASE_TABLET | Freq: Once | ORAL | Status: AC
  Administered 2019-01-04: 40 meq via ORAL
  Filled 2019-01-04: qty 2

## 2019-01-04 MED ORDER — CEPHALEXIN 500 MG PO CAPS
500.0000 mg | ORAL_CAPSULE | Freq: Four times a day (QID) | ORAL | Status: DC
  Administered 2019-01-04: 500 mg via ORAL
  Filled 2019-01-04: qty 1

## 2019-01-04 MED ORDER — DOCUSATE SODIUM 100 MG PO CAPS: 100.0000 mg | ORAL_CAPSULE | Freq: Two times a day (BID) | ORAL | 0 refills | Status: AC

## 2019-01-04 NOTE — Progress Notes (Signed)
OT Cancellation Note  Patient Details Name: Arthur [name] MRN: [id number] DOB: 04/14/1946   Cancelled Treatment:    Reason Eval/Treat Not Completed: Other (comment). Pt preparing for discharge to SNF. Unavailable for OT evaluation at this time. Will re-attempt as appropriate.   Richrd Prime, MPH, MS, OTR/L ascom [phone] 01/04/19, 1:11 PM

## 2019-01-04 NOTE — Progress Notes (Signed)
PHARMACIST - PHYSICIAN COMMUNICATION  CONCERNING:  Enoxaparin (Lovenox) for DVT Prophylaxis    RECOMMENDATION: Patient was prescribed enoxaprin 30mg  q24 hours for VTE prophylaxis.   Filed Weights   12/30/18 0919  Weight: 160 lb (72.6 kg)    Body mass index is 21.11 kg/m.  Estimated Creatinine Clearance: 62.2 mL/min (by C-G formula based on SCr of 1.07 mg/dL).  Patient is now a candidate for enoxaparin 40mg  every 24 hours based on CrCl >64ml/min (improvement from previous dose adj) DESCRIPTION:  Pharmacy has adjusted enoxaparin dose per [facility] policy.  Patient is now receiving enoxaparin 40mg  every 24 hours.   Albina Billet, PharmD, BCPS Clinical Pharmacist 01/04/2019 7:54 AM

## 2019-01-04 NOTE — Discharge Summary (Addendum)
[employer] [facility] [place] at [facility]   PATIENT NAME: Arthur [name]    MR#:  [id number]  DATE OF BIRTH:  05/30/1946  DATE OF ADMISSION:  12/30/2018   ADMITTING PHYSICIAN: Ihor Austin, MD  DATE OF DISCHARGE: 01/04/2019 PRIMARY CARE PHYSICIAN: Crummett, Lysle Rubens, NP   ADMISSION DIAGNOSIS:  Pain [R52] Urinary tract infection, acute [N39.0] Closed left hip fracture, initial encounter (HCC) [S72.002A] DISCHARGE DIAGNOSIS:  Active Problems:   Hip fracture (HCC)  SECONDARY DIAGNOSIS:   Past Medical History:  Diagnosis Date  . Alzheimer disease (HCC)   . Diabetes (HCC)   . Hypertension    HOSPITAL COURSE:  73 year old male patient with history of type 2 diabetes mellitus, hypertension, Alzheimer's dementia presented to the emergency room for fall   -Left hip fracture Status post ORIF procedure Lovenox 40 mg subcu daily x14 days TED hose bilateral lower extremities x6 weeks Physical therapy evaluation: SNF.   -Acute kidney injury improving Creatinine improved with IV fluid hydration. Avoid nephrotoxic medication  -Moderate hydronephrosis seen Secondary to bladder outlet obstruction Keep Foley catheter Continue Flomax, follow-up urologist as outpatient.  -UTI improving Klebsiella pneumonia UTI Continue oral cephalexin antibiotic for 2 more days.  -Dehydration improved  -Left hip pain Pain management  -Alzheimer's dementia Supportive care  -Type 2 diabetes mellitus Diabetic diet Sliding scale coverage with insulin, resume home DM medication after discharge.  Hypokalemia, KCl po. DISCHARGE CONDITIONS:  Stable, discharge to SNF today. CONSULTS OBTAINED:  Treatment Team:  Kennedy Bucker, MD DRUG ALLERGIES:  No Known Allergies DISCHARGE MEDICATIONS:   Allergies as of 01/04/2019   No Known Allergies     Medication List    STOP taking these medications   traMADol 50 MG tablet Commonly known as:  ULTRAM     TAKE these medications    atorvastatin 10 MG tablet Commonly known as:  LIPITOR Take 10 mg by mouth daily.   bisacodyl 10 MG suppository Commonly known as:  DULCOLAX Place 1 suppository (10 mg total) rectally daily as needed for moderate constipation.   cephALEXin 500 MG capsule Commonly known as:  KEFLEX Take 1 capsule (500 mg total) by mouth every 6 (six) hours for 2 days.   docusate sodium 100 MG capsule Commonly known as:  COLACE Take 1 capsule (100 mg total) by mouth 2 (two) times daily.   enoxaparin 30 MG/0.3ML injection Commonly known as:  LOVENOX Inject 0.3 mLs (30 mg total) into the skin daily for 14 days.   glimepiride 2 MG tablet Commonly known as:  AMARYL Take 2 mg by mouth daily with breakfast.   HYDROcodone-acetaminophen 5-325 MG tablet Commonly known as:  NORCO/VICODIN Take 1-2 tablets by mouth every 4 (four) hours as needed for moderate pain (pain score 4-6).   Levemir FlexTouch 100 UNIT/ML Pen Generic drug:  Insulin Detemir Inject 15 Units into the skin every evening.   metFORMIN 850 MG tablet Commonly known as:  GLUCOPHAGE Take 850 mg by mouth 2 (two) times daily with a meal.   ondansetron 4 MG tablet Commonly known as:  ZOFRAN Take 4 mg by mouth every 8 (eight) hours as needed for nausea or vomiting.   senna-docusate 8.6-50 MG tablet Commonly known as:  Senokot-S Take 1 tablet by mouth at bedtime as needed for mild constipation.   sertraline 50 MG tablet Commonly known as:  ZOLOFT Take 100 mg by mouth daily.   tamsulosin 0.4 MG Caps capsule Commonly known as:  FLOMAX Take 1 capsule (0.4 mg total)  by mouth daily after supper.   vitamin B-12 1000 MCG tablet Commonly known as:  CYANOCOBALAMIN Take 1,000 mcg by mouth daily.        DISCHARGE INSTRUCTIONS:  See AVS.  If you experience worsening of your admission symptoms, develop shortness of breath, life threatening emergency, suicidal or homicidal thoughts you must seek medical attention immediately by calling  911 or calling your MD immediately  if symptoms less severe.  You Must read complete instructions/literature along with all the possible adverse reactions/side effects for all the Medicines you take and that have been prescribed to you. Take any new Medicines after you have completely understood and accpet all the possible adverse reactions/side effects.   Please note  You were cared for by a hospitalist during your hospital stay. If you have any questions about your discharge medications or the care you received while you were in the hospital after you are discharged, you can call the unit and asked to speak with the hospitalist on call if the hospitalist that took care of you is not available. Once you are discharged, your primary care physician will handle any further medical issues. Please note that NO REFILLS for any discharge medications will be authorized once you are discharged, as it is imperative that you return to your primary care physician (or establish a relationship with a primary care physician if you do not have one) for your aftercare needs so that they can reassess your need for medications and monitor your lab values.    On the day of Discharge:  VITAL SIGNS:  Blood pressure 138/81, pulse 84, temperature (!) 97.4 F (36.3 C), temperature source Oral, resp. rate 17, height  (1.854 m), weight 72.6 kg, SpO2 95 %. PHYSICAL EXAMINATION:  GENERAL:  73 y.o.-year-old patient lying in the bed with no acute distress.  EYES: Pupils equal, round, reactive to light and accommodation. No scleral icterus. Extraocular muscles intact.  HEENT: Head atraumatic, normocephalic. Oropharynx and nasopharynx clear.  NECK:  Supple, no jugular venous distention. No thyroid enlargement, no tenderness.  LUNGS: Normal breath sounds bilaterally, no wheezing, rales,rhonchi or crepitation. No use of accessory muscles of respiration.  CARDIOVASCULAR: S1, S2 normal. No murmurs, rubs, or gallops.  ABDOMEN:  Soft, non-tender, non-distended. Bowel sounds present. No organomegaly or mass.  EXTREMITIES: No pedal edema, cyanosis, or clubbing.  NEUROLOGIC: Cranial nerves II through XII are intact. Muscle strength 5/5 in all extremities. Sensation intact. Gait not checked.  PSYCHIATRIC: The patient is alert and oriented x 1.  SKIN: No obvious rash, lesion, or ulcer.  DATA REVIEW:   CBC Recent Labs  Lab 01/03/19 0313  WBC 10.6*  HGB 9.8*  HCT 29.4*  PLT 233    Chemistries  Recent Labs  Lab 01/04/19 0324  NA 139  K 3.3*  CL 106  CO2 22  GLUCOSE 120*  BUN 24*  CREATININE 1.07  CALCIUM 8.3*  MG 1.8     Microbiology Results  Results for orders placed or performed during the hospital encounter of 12/30/18  Urine Culture     Status: Abnormal   Collection Time: 12/30/18 12:07 PM  Result Value Ref Range Status   Specimen Description   Final    URINE, RANDOM Performed at [facility], [address], [place], [place] [address]    Special Requests   Final    Normal Performed at [facility], [address] [place], [place] [address]    Culture >=100,000 COLONIES/mL KLEBSIELLA PNEUMONIAE (A)  Final   Report Status 01/01/2019 FINAL  Final   Organism ID, Bacteria KLEBSIELLA PNEUMONIAE (A)  Final      Susceptibility   Klebsiella pneumoniae - MIC*    AMPICILLIN >=32 RESISTANT Resistant     CEFAZOLIN <=4 SENSITIVE Sensitive     CEFTRIAXONE <=1 SENSITIVE Sensitive     CIPROFLOXACIN <=0.25 SENSITIVE Sensitive     GENTAMICIN <=1 SENSITIVE Sensitive     IMIPENEM <=0.25 SENSITIVE Sensitive     NITROFURANTOIN 128 RESISTANT Resistant     TRIMETH/SULFA <=20 SENSITIVE Sensitive     AMPICILLIN/SULBACTAM 8 SENSITIVE Sensitive     PIP/TAZO <=4 SENSITIVE Sensitive     Extended ESBL NEGATIVE Sensitive     * >=100,000 COLONIES/mL KLEBSIELLA PNEUMONIAE  SARS Coronavirus 2 (CEPHEID - Performed in [facility] hospital lab), Hosp Order     Status: None   Collection Time:  12/30/18 12:07 PM  Result Value Ref Range Status   SARS Coronavirus 2 NEGATIVE NEGATIVE Final    Comment: (NOTE) If result is NEGATIVE SARS-CoV-2 target nucleic acids are NOT DETECTED. The SARS-CoV-2 RNA is generally detectable in upper and lower  respiratory specimens during the acute phase of infection. The lowest  concentration of SARS-CoV-2 viral copies this assay can detect is 250  copies / mL. A negative result does not preclude SARS-CoV-2 infection  and should not be used as the sole basis for treatment or other  patient management decisions.  A negative result may occur with  improper specimen collection / handling, submission of specimen other  than nasopharyngeal swab, presence of viral mutation(s) within the  areas targeted by this assay, and inadequate number of viral copies  (<250 copies / mL). A negative result must be combined with clinical  observations, patient history, and epidemiological information. If result is POSITIVE SARS-CoV-2 target nucleic acids are DETECTED. The SARS-CoV-2 RNA is generally detectable in upper and lower  respiratory specimens dur ing the acute phase of infection.  Positive  results are indicative of active infection with SARS-CoV-2.  Clinical  correlation with patient history and other diagnostic information is  necessary to determine patient infection status.  Positive results do  not rule out bacterial infection or co-infection with other viruses. If result is PRESUMPTIVE POSTIVE SARS-CoV-2 nucleic acids MAY BE PRESENT.   A presumptive positive result was obtained on the submitted specimen  and confirmed on repeat testing.  While 2019 novel coronavirus  (SARS-CoV-2) nucleic acids may be present in the submitted sample  additional confirmatory testing may be necessary for epidemiological  and / or clinical management purposes  to differentiate between  SARS-CoV-2 and other Sarbecovirus currently known to infect humans.  If clinically  indicated additional testing with an alternate test  methodology [phone]) is advised. The SARS-CoV-2 RNA is generally  detectable in upper and lower respiratory sp ecimens during the acute  phase of infection. The expected result is Negative. Fact Sheet for Patients:  BoilerBrush.com.cy Fact Sheet for Healthcare Providers: https://pope.com/ This test is not yet approved or cleared by the [place] FDA and has been authorized for detection and/or diagnosis of SARS-CoV-2 by FDA under an Emergency Use Authorization (EUA).  This EUA will remain in effect (meaning this test can be used) for the duration of the COVID-19 declaration under Section 564(b)(1) of the Act, 21 U.S.C. section 360bbb-3(b)(1), unless the authorization is terminated or revoked sooner. Performed at [facility], [address] [place], [place] [address]   Surgical PCR screen  Status: None   Collection Time: 12/30/18  5:55 PM  Result Value Ref Range Status   MRSA, PCR NEGATIVE NEGATIVE Final   Staphylococcus aureus NEGATIVE NEGATIVE Final    Comment: (NOTE) The Xpert SA Assay (FDA approved for NASAL specimens in patients 1 years of age and older), is one component of a comprehensive surveillance program. It is not intended to diagnose infection nor to guide or monitor treatment. Performed at [facility], [address] [place], [place] [address]   SARS Coronavirus 2 (CEPHEID - Performed in [facility] hospital lab), Hosp Order     Status: None   Collection Time: 01/03/19  5:21 PM  Result Value Ref Range Status   SARS Coronavirus 2 NEGATIVE NEGATIVE Final    Comment: (NOTE) If result is NEGATIVE SARS-CoV-2 target nucleic acids are NOT DETECTED. The SARS-CoV-2 RNA is generally detectable in upper and lower  respiratory specimens during the acute phase of infection. The lowest  concentration of SARS-CoV-2 viral copies this assay can  detect is 250  copies / mL. A negative result does not preclude SARS-CoV-2 infection  and should not be used as the sole basis for treatment or other  patient management decisions.  A negative result may occur with  improper specimen collection / handling, submission of specimen other  than nasopharyngeal swab, presence of viral mutation(s) within the  areas targeted by this assay, and inadequate number of viral copies  (<250 copies / mL). A negative result must be combined with clinical  observations, patient history, and epidemiological information. If result is POSITIVE SARS-CoV-2 target nucleic acids are DETECTED. The SARS-CoV-2 RNA is generally detectable in upper and lower  respiratory specimens dur ing the acute phase of infection.  Positive  results are indicative of active infection with SARS-CoV-2.  Clinical  correlation with patient history and other diagnostic information is  necessary to determine patient infection status.  Positive results do  not rule out bacterial infection or co-infection with other viruses. If result is PRESUMPTIVE POSTIVE SARS-CoV-2 nucleic acids MAY BE PRESENT.   A presumptive positive result was obtained on the submitted specimen  and confirmed on repeat testing.  While 2019 novel coronavirus  (SARS-CoV-2) nucleic acids may be present in the submitted sample  additional confirmatory testing may be necessary for epidemiological  and / or clinical management purposes  to differentiate between  SARS-CoV-2 and other Sarbecovirus currently known to infect humans.  If clinically indicated additional testing with an alternate test  methodology [phone]) is advised. The SARS-CoV-2 RNA is generally  detectable in upper and lower respiratory sp ecimens during the acute  phase of infection. The expected result is Negative. Fact Sheet for Patients:  BoilerBrush.com.cy Fact Sheet for Healthcare Providers:  https://pope.com/ This test is not yet approved or cleared by the [place] FDA and has been authorized for detection and/or diagnosis of SARS-CoV-2 by FDA under an Emergency Use Authorization (EUA).  This EUA will remain in effect (meaning this test can be used) for the duration of the COVID-19 declaration under Section 564(b)(1) of the Act, 21 U.S.C. section 360bbb-3(b)(1), unless the authorization is terminated or revoked sooner. Performed at [facility], [address], [place], [place] [address]     RADIOLOGY:  No results found.   Management plans discussed with the patient, family and they are in agreement.  CODE STATUS: Full Code   TOTAL TIME TAKING CARE OF THIS PATIENT:  minutes.    Shaune Pollack M.D on 01/04/2019 at  8:55 AM  Between 7am to 6pm - Pager - [phone]  After 6pm go to [email or web address] - Social research officer, governmentpassword EPAS [facility]  [facility] [place] [facility]  Office  [phone]  CC: Primary care physician; Crummett, Lysle Rubensaniel D, NP   Note: This dictation was prepared with Dragon dictation along with smaller phrase technology. Any transcriptional errors that result from this process are unintentional.

## 2019-01-04 NOTE — Discharge Instructions (Signed)
Lovenox 40 mg subcu daily x14 days TED hose bilateral lower extremities x6 weeks

## 2019-01-04 NOTE — Progress Notes (Signed)
   Subjective: 4 Days Post-Op Procedure(s) (LRB): CANNULATED HIP PINNING (Left) Patient reports pain as mild.   Patient is well, and has had no acute complaints or problems Denies any CP, SOB, ABD pain. We will continue therapy today.    Objective: Vital signs in last 24 hours: Temp:  [97.4 F (36.3 C)-98.7 F (37.1 C)] 97.4 F (36.3 C) (06/04 0801) Pulse Rate:  [79-88] 84 (06/04 0801) Resp:  [17] 17 (06/04 0017) BP: (126-138)/(71-81) 138/81 (06/04 0801) SpO2:  [93 %-97 %] 95 % (06/04 0801)  Intake/Output from previous day: 06/03 0701 - 06/04 0700 In: -  Out: 3900 [Urine:3900] Intake/Output this shift: Total I/O In: -  Out: 400 [Urine:400]  Recent Labs    01/03/19 0313  HGB 9.8*   Recent Labs    01/03/19 0313  WBC 10.6*  RBC 3.26*  HCT 29.4*  PLT 233   Recent Labs    01/03/19 0313 01/04/19 0324  NA 140 139  K 3.5 3.3*  CL 111 106  CO2 21* 22  BUN 44* 24*  CREATININE 2.37* 1.07  GLUCOSE 145* 120*  CALCIUM 8.2* 8.3*   No results for input(s): LABPT, INR in the last 72 hours.  EXAM General - Patient is Alert, Appropriate and Oriented Extremity - Neurovascular intact Sensation intact distally Intact pulses distally No cellulitis present Compartment soft Dressing - dressing C/D/I and scant drainage Motor Function - intact, moving foot and toes well on exam.   Past Medical History:  Diagnosis Date  . Alzheimer disease (HCC)   . Diabetes (HCC)   . Hypertension     Assessment/Plan:   4 Days Post-Op Procedure(s) (LRB): CANNULATED HIP PINNING (Left) Active Problems:   Hip fracture (HCC)  Estimated body mass index is 21.11 kg/m as calculated from the following:   Height as of this encounter: 6\' 1"  (1.854 m).   Weight as of this encounter: 72.6 kg. Advance diet Up with therapy  Acute post op blood loss anemia - Hgb stable. CM to assist with discharge   Skilled nursing facility to remove staples on 01/15/2019 Follow-up with [facility] orthopedics  in 6 weeks Lovenox 40 mg subcu daily x14 days TED hose bilateral lower extremities x6 weeks   DVT Prophylaxis - Lovenox, TED hose and SCDs Weight-Bearing as tolerated to left leg   T. Cranston Neighbor, PA-C [facility] 01/04/2019, 9:13 AM

## 2019-01-04 NOTE — TOC Transition Note (Signed)
Transition of Care [facility]) - CM/SW Discharge Note   Patient Details  Name: Arthur [name] MRN: [id number] Date of Birth: 07-19-1946  [facility] CM/SW Contact:  Barrie Dunker, RN Phone Number: 01/04/2019, 9:32 AM   Clinical Narrative:     S-poke to Arthur [name] at [facility] and obtained Room number the patient will go to room 42A today via EMS, I called and spoke to his daughter Arthur [name] and provided her with the room number and the phone number to the facility as [phone], I explained that she will need to go to the facility to fill out the paper work, I explained that I do not have an exact time for the patient to go to SNF as he will go via EMS and they put the patients in line to pick up for non urgent transport.  She stated understanding, the bedside nurse is to call report to [phone] when ready  Final next level of care: Skilled Nursing Facility([facility]) Barriers to Discharge: Barriers Resolved   Patient Goals and CMS Choice Patient states their goals for this hospitalization and ongoing recovery are:: go home CMS Medicare.gov Compare Post Acute Care list provided to:: Patient Choice offered to / list presented to : Patient  Discharge Placement                       Discharge Plan and Services   Discharge Planning Services: CM Consult                                 Social Determinants of Health (SDOH) Interventions     Readmission Risk Interventions No flowsheet data found.

## 2019-01-04 NOTE — Care Management Important Message (Signed)
Important Message  Patient Details  Name: Arthur [name] MRN: [id number] Date of Birth: May 07, 1946   Medicare Important Message Given:  Yes    Olegario Messier A Tarsha Blando 01/04/2019, 7:26 AM

## 2019-01-04 NOTE — Progress Notes (Signed)
Called report to Merchandiser, retail at [facility].

## 2019-01-04 NOTE — Progress Notes (Signed)
Pt was picked up by EMS D/C

## 2019-01-04 NOTE — Progress Notes (Signed)
Physical Therapy Treatment Patient Details Name: Arthur [name] MRN: [id number] DOB: Mar 04, 1946 Today's Date: 01/04/2019    History of Present Illness 72yo male who comes to [facility] on 5/[facility] after found on floor by family. Pt noted to have Left hip fracture, presents now s/p ORIF IM nailing of Left hip WBAT. PMH: Alzheimer's dementia, DM2, HTN, hemorrhagic CVA, Lt ankle fusion.     PT Comments    RN stated premedicated prior to session.  Participated in exercises as described below.  Resists exercises but does well with slow gentle movements.  To edge of bed with mod/max a x 2.  Once sitting, is able to sit with min guard.  Stood with walker with mod a x 2 but unable to take any steps or turn to recliner.  Pt sat and walker was removed.  HHA stand pivot transfer to recliner at bedside.  Nursing updated on transfer status.   Follow Up Recommendations  SNF     Equipment Recommendations  Rolling walker with 5" wheels;3in1 (PT);Wheelchair (measurements PT);Wheelchair cushion (measurements PT)    Recommendations for Other Services       Precautions / Restrictions Precautions Precautions: Fall Restrictions Weight Bearing Restrictions: Yes LLE Weight Bearing: Weight bearing as tolerated    Mobility  Bed Mobility Overal bed mobility: Needs Assistance Bed Mobility: Supine to Sit Rolling: Mod assist;+2 for physical assistance;Max assist   Supine to sit: Mod assist;Max assist;+2 for physical assistance     General bed mobility comments: does not resist or necessaarily help with transition  Transfers Overall transfer level: Needs assistance Equipment used: Rolling walker (2 wheeled) Transfers: Sit to/from Stand Sit to Stand: Mod assist;+2 physical assistance;Min assist         General transfer comment: stood with walker but unable to step.  stand pivot to recliner with +2 mod A.  Ambulation/Gait             General Gait Details: unable to get pt to attempt stepping with max  demo and vc's   Stairs             Wheelchair Mobility    Modified Rankin (Stroke Patients Only)       Balance Overall balance assessment: Needs assistance Sitting-balance support: No upper extremity supported;Feet supported Sitting balance-Leahy Scale: Fair     Standing balance support: Bilateral upper extremity supported Standing balance-Leahy Scale: Poor                              Cognition Arousal/Alertness: Awake/alert Behavior During Therapy: WFL for tasks assessed/performed;Restless Overall Cognitive Status: No family/caregiver present to determine baseline cognitive functioning                                 General Comments: Oriented to person      Exercises Total Joint Exercises Heel Slides: AAROM;Strengthening;Left;10 reps;Supine Hip ABduction/ADduction: AAROM;Strengthening;Left;10 reps;Supine Straight Leg Raises: 10 reps;AAROM;Strengthening;Left;Supine    General Comments        Pertinent Vitals/Pain Pain Assessment: Faces Faces Pain Scale: Hurts even more Pain Location: Left thigh/hip Pain Descriptors / Indicators: Operative site guarding;Grimacing;Guarding Pain Intervention(s): Monitored during session;Repositioned;Premedicated before session;Limited activity within patient's tolerance    Home Living                      Prior Function  PT Goals (current goals can now be found in the care plan section) Progress towards PT goals: Progressing toward goals    Frequency    7X/week      PT Plan Current plan remains appropriate    Co-evaluation              AM-PAC PT "6 Clicks" Mobility   Outcome Measure  Help needed turning from your back to your side while in a flat bed without using bedrails?: A Lot Help needed moving from lying on your back to sitting on the side of a flat bed without using bedrails?: Total Help needed moving to and from a bed to a chair (including a  wheelchair)?: A Lot Help needed standing up from a chair using your arms (e.g., wheelchair or bedside chair)?: A Lot Help needed to walk in hospital room?: Total Help needed climbing 3-5 steps with a railing? : Total 6 Click Score: 9    End of Session Equipment Utilized During Treatment: Gait belt Activity Tolerance: Patient limited by pain Patient left: in chair;with chair alarm set;with call bell/phone within reach Nurse Communication: Mobility status       Time: [id number] PT Time Calculation (min) (ACUTE ONLY): 16 min  Charges:  $Therapeutic Activity: 8-22 mins                     Danielle DessSarah Abad Manard, PTA 01/04/19, 1:28 PM

## 2019-01-04 NOTE — Progress Notes (Signed)
PHARMACY NOTE:  ANTIMICROBIAL RENAL DOSAGE ADJUSTMENT  Current antimicrobial regimen includes a mismatch between antimicrobial dosage and estimated renal function.  As per policy approved by the Pharmacy & Therapeutics and Medical Executive Committees, the antimicrobial dosage will be adjusted accordingly.  Current antimicrobial dosage:  Cephalexin 250mg  q12h  Indication: UTI  Renal Function:  Estimated Creatinine Clearance: 62.2 mL/min (by C-G formula based on SCr of 1.07 mg/dL).     Antimicrobial dosage has been changed to:  Cephalexin 500mg  q 6h  Additional comments:   Thank you for allowing pharmacy to be a part of this patient's care.  Albina Billet, PharmD, BCPS Clinical Pharmacist 01/04/2019 7:56 AM

## 2019-01-17 ENCOUNTER — Ambulatory Visit (INDEPENDENT_AMBULATORY_CARE_PROVIDER_SITE_OTHER): Payer: Medicare Other | Admitting: Urology

## 2019-01-17 ENCOUNTER — Encounter: Payer: Self-pay | Admitting: Urology

## 2019-01-17 ENCOUNTER — Other Ambulatory Visit: Payer: Self-pay

## 2019-01-17 VITALS — BP 111/69 | HR 88 | Ht 73.0 in

## 2019-01-17 DIAGNOSIS — N401 Enlarged prostate with lower urinary tract symptoms: Secondary | ICD-10-CM

## 2019-01-17 DIAGNOSIS — N138 Other obstructive and reflux uropathy: Secondary | ICD-10-CM | POA: Diagnosis not present

## 2019-01-17 DIAGNOSIS — N139 Obstructive and reflux uropathy, unspecified: Secondary | ICD-10-CM

## 2019-01-17 DIAGNOSIS — R339 Retention of urine, unspecified: Secondary | ICD-10-CM | POA: Diagnosis not present

## 2019-01-17 LAB — BLADDER SCAN AMB NON-IMAGING: Scan Result: >568

## 2019-01-17 NOTE — Progress Notes (Signed)
01/17/2019 8:17 AM   Arthur [name] 05-25-1946 [id number]  Referring provider: Rexene Edison, NP [address] [place],  [place] [address]  No chief complaint on file.   HPI:  Arthur [name] was referred for urinary retention. He underwent ORIF of a left hip fx 12/31/2018 and post-op his Cr rose from 1.09 to 4.63. An US revealed a distended bladder (~1400 cc) and mild - mod bilateral hydronephrosis. A foley was placed and he was started on tamsulosin. His Cr quickly recovered. He is here today and his foley was removed at some point. Patient and daughter are not sure when it was removed. He is not sure if he is voiding or if he has a good flow. His PVR is about 550 ml here. He is ambulating with a walker. Pt is not bothered. He does not have a painful inability to urinate.   They don't recall a prior h/o retention but he was incontinent prior to the hip fracture. 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   Modifying factors: There are no other modifying factors  Associated signs and symptoms: There are no other associated signs and symptoms Aggravating and relieving factors: There are no other aggravating or relieving factors Severity: Moderate Duration: Persistent    PMH: Past Medical History:  Diagnosis Date  . Alzheimer disease ([place])   . Diabetes ([place])   . Hypertension     Surgical History: Past Surgical History:  Procedure Laterality Date  . HIP PINNING,CANNULATED Left 12/31/2018   Procedure: CANNULATED HIP PINNING;  Surgeon: Hessie Knows, MD;  Location: [facility] ORS;  Service: Orthopedics;  Laterality: Left;  . KYPHOPLASTY N/A 01/23/2018   Procedure: [id number];  Surgeon: Hessie Knows, MD;  Location: [facility] ORS;  Service: Orthopedics;  Laterality: N/A;    Home Medications:  Allergies as of 01/17/2019   No Known Allergies     Medication List       Accurate as of January 17, 2019  8:17 AM. If you have any questions, ask your nurse or doctor.        atorvastatin  10 MG tablet Commonly known as: LIPITOR Take 10 mg by mouth daily.   bisacodyl 10 MG suppository Commonly known as: DULCOLAX Place 1 suppository (10 mg total) rectally daily as needed for moderate constipation.   docusate sodium 100 MG capsule Commonly known as: COLACE Take 1 capsule (100 mg total) by mouth 2 (two) times daily.   enoxaparin 30 MG/0.3ML injection Commonly known as: LOVENOX Inject 0.3 mLs (30 mg total) into the skin daily for 14 days.   glimepiride 2 MG tablet Commonly known as: AMARYL Take 2 mg by mouth daily with breakfast.   HYDROcodone-acetaminophen 5-325 MG tablet Commonly known as: NORCO/VICODIN Take 1-2 tablets by mouth every 4 (four) hours as needed for moderate pain (pain score 4-6).   Levemir FlexTouch 100 UNIT/ML Pen Generic drug: Insulin Detemir Inject 15 Units into the skin every evening.   metFORMIN 850 MG tablet Commonly known as: GLUCOPHAGE Take 850 mg by mouth 2 (two) times daily with a meal.   ondansetron 4 MG tablet Commonly known as: ZOFRAN Take 4 mg by mouth every 8 (eight) hours as needed for nausea or vomiting.   senna-docusate 8.6-50 MG tablet Commonly known as: Senokot-S Take 1 tablet by mouth at bedtime as needed for mild constipation.   sertraline 50 MG tablet Commonly known as: ZOLOFT Take 100 mg by mouth daily.   tamsulosin 0.4 MG Caps capsule Commonly known as: FLOMAX  Take 1 capsule (0.4 mg total) by mouth daily after supper.   vitamin B-12 1000 MCG tablet Commonly known as: CYANOCOBALAMIN Take 1,000 mcg by mouth daily.       Allergies: No Known Allergies  Family History: No family history on file.  Social History:  reports that he has quit smoking. He does not have any smokeless tobacco history on file. He reports previous alcohol use. He reports that he does not use drugs.  ROS:                                        Physical Exam: There were no vitals taken for this visit.   Constitutional:  Alert and oriented, No acute distress. HEENT: [place] AT, moist mucus membranes.  Trachea midline, no masses. Cardiovascular: No clubbing, cyanosis, or edema. Respiratory: Normal respiratory effort, no increased work of breathing. GI: Abdomen is soft, nontender, nondistended, no abdominal masses GU: No CVA tenderness, Penis uncircumcised, normal foreskin, testicles descended bilaterally and palpably normal, bilateral epididymis palpably normal, scrotum normal, diaper dry  Lymph: No cervical or inguinal lymphadenopathy. Skin: No rashes, bruises or suspicious lesions. Neurologic: Grossly intact, no focal deficits, moving all 4 extremities. Psychiatric: Normal mood and affect.   Laboratory Data: Lab Results  Component Value Date   WBC 10.6 (H) 01/03/2019   HGB 9.8 (L) 01/03/2019   HCT 29.4 (L) 01/03/2019   MCV 90.2 01/03/2019   PLT 233 01/03/2019    Lab Results  Component Value Date   CREATININE 1.07 01/04/2019    No results found for: PSA  No results found for: TESTOSTERONE  No results found for: HGBA1C  Urinalysis    Component Value Date/Time   COLORURINE YELLOW (A) 12/30/2018 1022   APPEARANCEUR HAZY (A) 12/30/2018 1022   APPEARANCEUR Clear 10/27/2012 0429   LABSPEC 1.022 12/30/2018 1022   LABSPEC 1.016 10/27/2012 0429   PHURINE 5.0 12/30/2018 1022   GLUCOSEU >=500 (A) 12/30/2018 1022   GLUCOSEU 150 mg/dL 16/10/960403/28/2014 [id number]   HGBUR MODERATE (A) 12/30/2018 1022   BILIRUBINUR NEGATIVE 12/30/2018 1022   BILIRUBINUR Negative 10/27/2012 0429   KETONESUR NEGATIVE 12/30/2018 1022   PROTEINUR 30 (A) 12/30/2018 1022   NITRITE POSITIVE (A) 12/30/2018 1022   LEUKOCYTESUR SMALL (A) 12/30/2018 1022   LEUKOCYTESUR Negative 10/27/2012 0429    Lab Results  Component Value Date   BACTERIA NONE SEEN 12/30/2018    Pertinent Imaging: Renal US images reviewed  No results found for this or any previous visit. No results found for this or any previous visit. No  results found for this or any previous visit. No results found for this or any previous visit. Results for orders placed during the hospital encounter of 12/30/18  US RENAL   Narrative CLINICAL DATA:  Acute renal failure.  EXAM: RENAL / URINARY TRACT ULTRASOUND COMPLETE  COMPARISON:  Limited correlation made with a chest CT 01/05/2018.  FINDINGS: Right Kidney:  Renal measurements: 11.8 x 5.4 x 5.5 cm = volume: 185 mL. Mild-to-moderate hydronephrosis. No significant cortical thinning or focal lesion identified.  Left Kidney:  Renal measurements: 10.3 x 5.4 x 4.4 cm = volume: 127 mL. Mild-to-moderate hydronephrosis. Mild cortical thinning.  Bladder:  The bladder is distended with dependent debris. Pre voiding bladder volume approximately 1448 cc. Patient unable to void during the examination.  IMPRESSION: 1. Mild-to-moderate bilateral hydronephrosis, nonspecific in etiology given the distended bladder. This could  be due to bladder outlet obstruction. Repeat ultrasound after bladder emptying or Foley catheter placement could be performed to exclude ureteral obstruction. 2. Debris in the bladder. 3. Mild renal cortical thinning on the left.   Electronically Signed   By: Carey BullocksWilliam  Veazey M.D.   On: 01/02/2019 11:17    No results found for this or any previous visit. No results found for this or any previous visit. No results found for this or any previous visit.  Assessment & Plan:    BPH - continue tamsulosin   Urinary retention, reflux - check renal US. He may need foley replaced.   No follow-ups on file.  Jerilee FieldMatthew Channa Hazelett, MD  [facility] [address], [address] [place], [place] [address] [phone]

## 2019-01-17 NOTE — Patient Instructions (Addendum)
Benign Prostatic Hyperplasia ° °Benign prostatic hyperplasia (BPH) is an enlarged prostate gland that is caused by the normal aging process and not by cancer. The prostate is a walnut-sized gland that is involved in the production of semen. It is located in front of the rectum and below the bladder. The bladder stores urine and the urethra is the tube that carries the urine out of the body. The prostate may get bigger as a man gets older. °An enlarged prostate can press on the urethra. This can make it harder to pass urine. The build-up of urine in the bladder can cause infection. Back pressure and infection may progress to bladder damage and kidney (renal) failure. °What are the causes? °This condition is part of a normal aging process. However, not all men develop problems from this condition. If the prostate enlarges away from the urethra, urine flow will not be blocked. If it enlarges toward the urethra and compresses it, there will be problems passing urine. °What increases the risk? °This condition is more likely to develop in men over the age of 50 years. °What are the signs or symptoms? °Symptoms of this condition include: °· Getting up often during the night to urinate. °· Needing to urinate frequently during the day. °· Difficulty starting urine flow. °· Decrease in size and strength of your urine stream. °· Leaking (dribbling) after urinating. °· Inability to pass urine. This needs immediate treatment. °· Inability to completely empty your bladder. °· Pain when you pass urine. This is more common if there is also an infection. °· Urinary tract infection (UTI). °How is this diagnosed? °This condition is diagnosed based on your medical history, a physical exam, and your symptoms. Tests will also be done, such as: °· A post-void bladder scan. This measures any amount of urine that may remain in your bladder after you finish urinating. °· A digital rectal exam. In a rectal exam, your health care provider  checks your prostate by putting a lubricated, gloved finger into your rectum to feel the back of your prostate gland. This exam detects the size of your gland and any abnormal lumps or growths. °· An exam of your urine (urinalysis). °· A prostate specific antigen (PSA) screening. This is a blood test used to screen for prostate cancer. °· An ultrasound. This test uses sound waves to electronically produce a picture of your prostate gland. °Your health care provider may refer you to a specialist in kidney and prostate diseases (urologist). °How is this treated? °Once symptoms begin, your health care provider will monitor your condition (active surveillance or watchful waiting). Treatment for this condition will depend on the severity of your condition. Treatment may include: °· Observation and yearly exams. This may be the only treatment needed if your condition and symptoms are mild. °· Medicines to relieve your symptoms, including: °? Medicines to shrink the prostate. °? Medicines to relax the muscle of the prostate. °· Surgery in severe cases. Surgery may include: °? Prostatectomy. In this procedure, the prostate tissue is removed completely through an open incision or with a laparascope or robotics. °? Transurethral resection of the prostate (TURP). In this procedure, a tool is inserted through the opening at the tip of the penis (urethra). It is used to cut away tissue of the inner core of the prostate. The pieces are removed through the same opening of the penis. This removes the blockage. °? Transurethral incision (TUIP). In this procedure, small cuts are made in the prostate. This lessens   the prostate's pressure on the urethra. ? Transurethral microwave thermotherapy (TUMT). This procedure uses microwaves to create heat. The heat destroys and removes a small amount of prostate tissue. ? Transurethral needle ablation (TUNA). This procedure uses radio frequencies to destroy and remove a small amount of  prostate tissue. ? Interstitial laser coagulation (ILC). This procedure uses a laser to destroy and remove a small amount of prostate tissue. ? Transurethral electrovaporization (TUVP). This procedure uses electrodes to destroy and remove a small amount of prostate tissue. ? Prostatic urethral lift. This procedure inserts an implant to push the lobes of the prostate away from the urethra. Follow these instructions at home:  Take over-the-counter and prescription medicines only as told by your health care provider.  Monitor your symptoms for any changes. Contact your health care provider with any changes.  Avoid drinking large amounts of liquid before going to bed or out in public.  Avoid or reduce how much caffeine or alcohol you drink.  Give yourself time when you urinate.  Keep all follow-up visits as told by your health care provider. This is important. Contact a health care provider if:  You have unexplained back pain.  Your symptoms do not get better with treatment.  You develop side effects from the medicine you are taking.  Your urine becomes very dark or has a bad smell.  Your lower abdomen becomes distended and you have trouble passing your urine. Get help right away if:  You have a fever or chills.  You suddenly cannot urinate.  You feel lightheaded, or very dizzy, or you faint.  There are large amounts of blood or clots in the urine.  Your urinary problems become hard to manage.  You develop moderate to severe low back or flank pain. The flank is the side of your body between the ribs and the hip. These symptoms may represent a serious problem that is an emergency. Do not wait to see if the symptoms will go away. Get medical help right away. Call your local emergency services (911 in the [place] Do not drive yourself to the hospital. Summary  Benign prostatic hyperplasia (BPH) is an enlarged prostate that is caused by the normal aging process and not by cancer.   An enlarged prostate can press on the urethra. This can make it hard to pass urine.  This condition is part of a normal aging process and is more likely to develop in men over the age of 50 years.  Get help right away if you suddenly cannot urinate. This information is not intended to replace advice given to you by your health care provider. Make sure you discuss any questions you have with your health care provider. Document Released: 07/19/2005 Document Revised: 08/23/2016 Document Reviewed: 08/23/2016 Elsevier Interactive Patient Education  2019 [employer] Acute Urinary Retention, Male  Acute urinary retention means that you cannot pee (urinate) at all, or that you pee too little and your bladder is not emptied completely. If it is not treated, it can lead to kidney damage or other serious problems. Follow these instructions at home:  Take over-the-counter and prescription medicines only as told by your doctor. Ask your doctor what medicines you should stay away from. Do not take any medicine unless your doctor says it is okay to do so.  If you were sent home with a tube that drains the bladder (catheter), take care of it as told by your doctor.  Drink enough fluid to keep your pee clear or   pale yellow.  If you were given an antibiotic, take it as told by your doctor. Do not stop taking the antibiotic even if you start to feel better.  Do not use any products that contain nicotine or tobacco, such as cigarettes and e-cigarettes. If you need help quitting, ask your doctor.  Watch for changes in your symptoms. Tell your doctor about them.  If told, track changes in your blood pressure at home. Tell your doctor about them.  Keep all follow-up visits as told by your doctor. This is important. Contact a doctor if:  You have spasms or you leak pee when you have spasms. Get help right away if:  You have chills or a fever.  You have a tube that drains the bladder and: ? The tube  stops draining pee. ? The tube falls out.  You have blood in your pee. Summary  Acute urinary retention means that you cannot pee at all, or that you pee too little and your bladder is not emptied completely. If it is not treated, it can result in kidney damage or other serious problems.  If you were sent home with a tube that drains the bladder, take care of it as told by your doctor.  Monitor any changes in your symptoms. Tell your doctor about any changes. This information is not intended to replace advice given to you by your health care provider. Make sure you discuss any questions you have with your health care provider. Document Released: 01/05/2008 Document Revised: 08/20/2016 Document Reviewed: 08/20/2016 Elsevier Interactive Patient Education  2019 Else[employer] Inc.  

## 2019-01-22 ENCOUNTER — Telehealth: Payer: Self-pay | Admitting: Urology

## 2019-02-28 ENCOUNTER — Encounter: Payer: Self-pay | Admitting: Urology

## 2019-02-28 ENCOUNTER — Other Ambulatory Visit: Payer: Medicare Other | Admitting: Urology

## 2019-07-03 ENCOUNTER — Emergency Department: Payer: Medicare Other

## 2019-07-03 ENCOUNTER — Inpatient Hospital Stay
Admission: EM | Admit: 2019-07-03 | Discharge: 2019-07-06 | DRG: 698 | Disposition: A | Discharge status: Hospice | Payer: Medicare Other | Attending: Family Medicine | Admitting: Family Medicine

## 2019-07-03 ENCOUNTER — Other Ambulatory Visit: Payer: Self-pay

## 2019-07-03 ENCOUNTER — Encounter: Payer: Self-pay | Admitting: Emergency Medicine

## 2019-07-03 DIAGNOSIS — E11621 Type 2 diabetes mellitus with foot ulcer: Secondary | ICD-10-CM | POA: Diagnosis present

## 2019-07-03 DIAGNOSIS — E11622 Type 2 diabetes mellitus with other skin ulcer: Secondary | ICD-10-CM | POA: Diagnosis present

## 2019-07-03 DIAGNOSIS — L84 Corns and callosities: Secondary | ICD-10-CM | POA: Diagnosis present

## 2019-07-03 DIAGNOSIS — T83511A Infection and inflammatory reaction due to indwelling urethral catheter, initial encounter: Principal | ICD-10-CM | POA: Diagnosis present

## 2019-07-03 DIAGNOSIS — E1122 Type 2 diabetes mellitus with diabetic chronic kidney disease: Secondary | ICD-10-CM | POA: Diagnosis present

## 2019-07-03 DIAGNOSIS — L98499 Non-pressure chronic ulcer of skin of other sites with unspecified severity: Secondary | ICD-10-CM | POA: Diagnosis present

## 2019-07-03 DIAGNOSIS — B359 Dermatophytosis, unspecified: Secondary | ICD-10-CM | POA: Diagnosis present

## 2019-07-03 DIAGNOSIS — Y846 Urinary catheterization as the cause of abnormal reaction of the patient, or of later complication, without mention of misadventure at the time of the procedure: Secondary | ICD-10-CM | POA: Diagnosis present

## 2019-07-03 DIAGNOSIS — Z79899 Other long term (current) drug therapy: Secondary | ICD-10-CM | POA: Diagnosis not present

## 2019-07-03 DIAGNOSIS — G309 Alzheimer's disease, unspecified: Secondary | ICD-10-CM | POA: Diagnosis present

## 2019-07-03 DIAGNOSIS — R140 Abdominal distension (gaseous): Secondary | ICD-10-CM

## 2019-07-03 DIAGNOSIS — N182 Chronic kidney disease, stage 2 (mild): Secondary | ICD-10-CM | POA: Diagnosis present

## 2019-07-03 DIAGNOSIS — Z794 Long term (current) use of insulin: Secondary | ICD-10-CM

## 2019-07-03 DIAGNOSIS — R338 Other retention of urine: Secondary | ICD-10-CM | POA: Diagnosis present

## 2019-07-03 DIAGNOSIS — N390 Urinary tract infection, site not specified: Secondary | ICD-10-CM | POA: Diagnosis present

## 2019-07-03 DIAGNOSIS — Z87891 Personal history of nicotine dependence: Secondary | ICD-10-CM

## 2019-07-03 DIAGNOSIS — F0280 Dementia in other diseases classified elsewhere without behavioral disturbance: Secondary | ICD-10-CM | POA: Diagnosis present

## 2019-07-03 DIAGNOSIS — N179 Acute kidney failure, unspecified: Secondary | ICD-10-CM | POA: Diagnosis present

## 2019-07-03 DIAGNOSIS — Z66 Do not resuscitate: Secondary | ICD-10-CM | POA: Diagnosis present

## 2019-07-03 DIAGNOSIS — I129 Hypertensive chronic kidney disease with stage 1 through stage 4 chronic kidney disease, or unspecified chronic kidney disease: Secondary | ICD-10-CM | POA: Diagnosis present

## 2019-07-03 DIAGNOSIS — A419 Sepsis, unspecified organism: Secondary | ICD-10-CM | POA: Diagnosis present

## 2019-07-03 DIAGNOSIS — R112 Nausea with vomiting, unspecified: Secondary | ICD-10-CM

## 2019-07-03 DIAGNOSIS — N401 Enlarged prostate with lower urinary tract symptoms: Secondary | ICD-10-CM | POA: Diagnosis present

## 2019-07-03 DIAGNOSIS — L97529 Non-pressure chronic ulcer of other part of left foot with unspecified severity: Secondary | ICD-10-CM | POA: Diagnosis present

## 2019-07-03 DIAGNOSIS — F329 Major depressive disorder, single episode, unspecified: Secondary | ICD-10-CM | POA: Diagnosis present

## 2019-07-03 DIAGNOSIS — Z20828 Contact with and (suspected) exposure to other viral communicable diseases: Secondary | ICD-10-CM | POA: Diagnosis present

## 2019-07-03 DIAGNOSIS — E1110 Type 2 diabetes mellitus with ketoacidosis without coma: Secondary | ICD-10-CM | POA: Diagnosis present

## 2019-07-03 DIAGNOSIS — F32A Depression, unspecified: Secondary | ICD-10-CM

## 2019-07-03 DIAGNOSIS — N400 Enlarged prostate without lower urinary tract symptoms: Secondary | ICD-10-CM

## 2019-07-03 LAB — URINALYSIS, COMPLETE (UACMP) WITH MICROSCOPIC
Bilirubin Urine: NEGATIVE
Glucose, UA: 50 mg/dL — AB
Ketones, ur: 5 mg/dL — AB
Nitrite: NEGATIVE
Protein, ur: >=300 mg/dL — AB
RBC / HPF: >50 RBC/hpf — ABNORMAL HIGH (ref 0–5)
Specific Gravity, Urine: 1.019 (ref 1.005–1.030)
Squamous Epithelial / HPF: NONE SEEN (ref 0–5)
WBC, UA: >50 WBC/hpf — ABNORMAL HIGH (ref 0–5)
pH: 7 (ref 5.0–8.0)

## 2019-07-03 LAB — BLOOD GAS, VENOUS
Acid-base deficit: 0.8 mmol/L (ref 0.0–2.0)
Bicarbonate: 23.5 mmol/L (ref 20.0–28.0)
O2 Saturation: 68 %
Patient temperature: 37
pCO2, Ven: 37 mmHg — ABNORMAL LOW (ref 44.0–60.0)
pH, Ven: 7.41 (ref 7.250–7.430)
pO2, Ven: 35 mmHg (ref 32.0–45.0)

## 2019-07-03 LAB — BASIC METABOLIC PANEL
Anion gap: 15 (ref 5–15)
Anion gap: 11 (ref 5–15)
Anion gap: 10 (ref 5–15)
BUN: 40 mg/dL — ABNORMAL HIGH (ref 8–23)
BUN: 37 mg/dL — ABNORMAL HIGH (ref 8–23)
BUN: 35 mg/dL — ABNORMAL HIGH (ref 8–23)
CO2: 21 mmol/L — ABNORMAL LOW (ref 22–32)
CO2: 24 mmol/L (ref 22–32)
CO2: 24 mmol/L (ref 22–32)
Calcium: 8.6 mg/dL — ABNORMAL LOW (ref 8.9–10.3)
Calcium: 8.7 mg/dL — ABNORMAL LOW (ref 8.9–10.3)
Calcium: 8.5 mg/dL — ABNORMAL LOW (ref 8.9–10.3)
Chloride: 104 mmol/L (ref 98–111)
Chloride: 103 mmol/L (ref 98–111)
Chloride: 105 mmol/L (ref 98–111)
Creatinine, Ser: 1.44 mg/dL — ABNORMAL HIGH (ref 0.61–1.24)
Creatinine, Ser: 1.24 mg/dL (ref 0.61–1.24)
Creatinine, Ser: 1.08 mg/dL (ref 0.61–1.24)
GFR calc Af Amer: 55 mL/min — ABNORMAL LOW (ref >60)
GFR calc Af Amer: >60 mL/min (ref >60)
GFR calc Af Amer: >60 mL/min (ref >60)
GFR calc non Af Amer: 47 mL/min — ABNORMAL LOW (ref >60)
GFR calc non Af Amer: 57 mL/min — ABNORMAL LOW (ref >60)
GFR calc non Af Amer: >60 mL/min (ref >60)
Glucose, Bld: 191 mg/dL — ABNORMAL HIGH (ref 70–99)
Glucose, Bld: 170 mg/dL — ABNORMAL HIGH (ref 70–99)
Glucose, Bld: 129 mg/dL — ABNORMAL HIGH (ref 70–99)
Potassium: 3.1 mmol/L — ABNORMAL LOW (ref 3.5–5.1)
Potassium: 2.9 mmol/L — ABNORMAL LOW (ref 3.5–5.1)
Potassium: 3 mmol/L — ABNORMAL LOW (ref 3.5–5.1)
Sodium: 140 mmol/L (ref 135–145)
Sodium: 138 mmol/L (ref 135–145)
Sodium: 139 mmol/L (ref 135–145)

## 2019-07-03 LAB — CBC
HCT: 40.4 % (ref 39.0–52.0)
HCT: 31.4 % — ABNORMAL LOW (ref 39.0–52.0)
Hemoglobin: 13.7 g/dL (ref 13.0–17.0)
Hemoglobin: 10.5 g/dL — ABNORMAL LOW (ref 13.0–17.0)
MCH: 29.5 pg (ref 26.0–34.0)
MCH: 29.7 pg (ref 26.0–34.0)
MCHC: 33.9 g/dL (ref 30.0–36.0)
MCHC: 33.4 g/dL (ref 30.0–36.0)
MCV: 87.1 fL (ref 80.0–100.0)
MCV: 89 fL (ref 80.0–100.0)
Platelets: 418 10*3/uL — ABNORMAL HIGH (ref 150–400)
Platelets: 338 10*3/uL (ref 150–400)
RBC: 4.64 MIL/uL (ref 4.22–5.81)
RBC: 3.53 MIL/uL — ABNORMAL LOW (ref 4.22–5.81)
RDW: 16.2 % — ABNORMAL HIGH (ref 11.5–15.5)
RDW: 16.2 % — ABNORMAL HIGH (ref 11.5–15.5)
WBC: 19.9 10*3/uL — ABNORMAL HIGH (ref 4.0–10.5)
WBC: 16 10*3/uL — ABNORMAL HIGH (ref 4.0–10.5)
nRBC: 0 % (ref 0.0–0.2)
nRBC: 0 % (ref 0.0–0.2)

## 2019-07-03 LAB — HEMOGLOBIN A1C
Hgb A1c MFr Bld: 10.5 % — ABNORMAL HIGH (ref 4.8–5.6)
Mean Plasma Glucose: 254.65 mg/dL

## 2019-07-03 LAB — BETA-HYDROXYBUTYRIC ACID
Beta-Hydroxybutyric Acid: 0.77 mmol/L — ABNORMAL HIGH (ref 0.05–0.27)
Beta-Hydroxybutyric Acid: 0.24 mmol/L (ref 0.05–0.27)

## 2019-07-03 LAB — COMPREHENSIVE METABOLIC PANEL
ALT: 13 U/L (ref 0–44)
AST: 21 U/L (ref 15–41)
Albumin: 3.9 g/dL (ref 3.5–5.0)
Alkaline Phosphatase: 92 U/L (ref 38–126)
Anion gap: 22 — ABNORMAL HIGH (ref 5–15)
BUN: 49 mg/dL — ABNORMAL HIGH (ref 8–23)
CO2: 16 mmol/L — ABNORMAL LOW (ref 22–32)
Calcium: 9.2 mg/dL (ref 8.9–10.3)
Chloride: 96 mmol/L — ABNORMAL LOW (ref 98–111)
Creatinine, Ser: 2.16 mg/dL — ABNORMAL HIGH (ref 0.61–1.24)
GFR calc Af Amer: 33 mL/min — ABNORMAL LOW (ref >60)
GFR calc non Af Amer: 29 mL/min — ABNORMAL LOW (ref >60)
Glucose, Bld: 446 mg/dL — ABNORMAL HIGH (ref 70–99)
Potassium: 4 mmol/L (ref 3.5–5.1)
Sodium: 134 mmol/L — ABNORMAL LOW (ref 135–145)
Total Bilirubin: 2.6 mg/dL — ABNORMAL HIGH (ref 0.3–1.2)
Total Protein: 7.8 g/dL (ref 6.5–8.1)

## 2019-07-03 LAB — LIPASE, BLOOD: Lipase: 19 U/L (ref 11–51)

## 2019-07-03 LAB — GLUCOSE, CAPILLARY
Glucose-Capillary: 428 mg/dL — ABNORMAL HIGH (ref 70–99)
Glucose-Capillary: 301 mg/dL — ABNORMAL HIGH (ref 70–99)
Glucose-Capillary: 164 mg/dL — ABNORMAL HIGH (ref 70–99)
Glucose-Capillary: 121 mg/dL — ABNORMAL HIGH (ref 70–99)
Glucose-Capillary: 139 mg/dL — ABNORMAL HIGH (ref 70–99)
Glucose-Capillary: 157 mg/dL — ABNORMAL HIGH (ref 70–99)
Glucose-Capillary: 103 mg/dL — ABNORMAL HIGH (ref 70–99)
Glucose-Capillary: 45 mg/dL — ABNORMAL LOW (ref 70–99)
Glucose-Capillary: 121 mg/dL — ABNORMAL HIGH (ref 70–99)
Glucose-Capillary: 135 mg/dL — ABNORMAL HIGH (ref 70–99)

## 2019-07-03 LAB — LACTIC ACID, PLASMA
Lactic Acid, Venous: 3.4 mmol/L (ref 0.5–1.9)
Lactic Acid, Venous: 3 mmol/L (ref 0.5–1.9)

## 2019-07-03 LAB — TROPONIN I (HIGH SENSITIVITY)
Troponin I (High Sensitivity): 11 ng/L (ref <18)
Troponin I (High Sensitivity): 18 ng/L — ABNORMAL HIGH (ref <18)

## 2019-07-03 LAB — SARS CORONAVIRUS 2 (TAT 6-24 HRS): SARS Coronavirus 2: NEGATIVE

## 2019-07-03 MED ORDER — POTASSIUM CHLORIDE CRYS ER 20 MEQ PO TBCR
40.0000 meq | EXTENDED_RELEASE_TABLET | Freq: Once | ORAL | Status: AC
  Administered 2019-07-03: 40 meq via ORAL
  Filled 2019-07-03: qty 2

## 2019-07-03 MED ORDER — POTASSIUM CHLORIDE 10 MEQ/100ML IV SOLN
10.0000 meq | INTRAVENOUS | Status: AC
  Administered 2019-07-03 (×2): 10 meq via INTRAVENOUS
  Filled 2019-07-03 (×2): qty 100

## 2019-07-03 MED ORDER — HYDRALAZINE HCL 20 MG/ML IJ SOLN: 10.0000 mg | INTRAMUSCULAR | Status: DC | PRN

## 2019-07-03 MED ORDER — DEXTROSE-NACL 5-0.45 % IV SOLN
INTRAVENOUS | Status: DC
  Administered 2019-07-03: 20:00:00 via INTRAVENOUS

## 2019-07-03 MED ORDER — SODIUM CHLORIDE 0.9 % IV SOLN: 1.0000 g | INTRAVENOUS | Status: DC

## 2019-07-03 MED ORDER — SODIUM CHLORIDE 0.9 % IV BOLUS
1000.0000 mL | Freq: Once | INTRAVENOUS | Status: AC
  Administered 2019-07-03: 1000 mL via INTRAVENOUS

## 2019-07-03 MED ORDER — INSULIN GLARGINE 100 UNIT/ML ~~LOC~~ SOLN
15.0000 [IU] | Freq: Every day | SUBCUTANEOUS | Status: DC
  Administered 2019-07-03 – 2019-07-05 (×2): 15 [IU] via SUBCUTANEOUS
  Filled 2019-07-03 (×4): qty 0.15

## 2019-07-03 MED ORDER — INSULIN ASPART 100 UNIT/ML ~~LOC~~ SOLN: 0.0000 [IU] | Freq: Every day | SUBCUTANEOUS | Status: DC

## 2019-07-03 MED ORDER — SODIUM CHLORIDE 0.9 % IV SOLN
1.0000 g | INTRAVENOUS | Status: DC
  Administered 2019-07-03 – 2019-07-06 (×4): 1 g via INTRAVENOUS
  Filled 2019-07-03 (×2): qty 10
  Filled 2019-07-03 (×2): qty 1

## 2019-07-03 MED ORDER — INSULIN ASPART 100 UNIT/ML ~~LOC~~ SOLN
0.0000 [IU] | Freq: Three times a day (TID) | SUBCUTANEOUS | Status: DC
  Administered 2019-07-03 – 2019-07-05 (×2): 3 [IU] via SUBCUTANEOUS
  Administered 2019-07-05 (×2): 2 [IU] via SUBCUTANEOUS
  Administered 2019-07-06: 3 [IU] via SUBCUTANEOUS
  Filled 2019-07-03 (×5): qty 1

## 2019-07-03 MED ORDER — HEPARIN SODIUM (PORCINE) 5000 UNIT/ML IJ SOLN
5000.0000 [IU] | Freq: Three times a day (TID) | INTRAMUSCULAR | Status: DC
  Administered 2019-07-03 – 2019-07-05 (×5): 5000 [IU] via SUBCUTANEOUS
  Filled 2019-07-03 (×5): qty 1

## 2019-07-03 MED ORDER — SODIUM CHLORIDE 0.9 % IV SOLN
INTRAVENOUS | Status: DC
  Administered 2019-07-03 – 2019-07-04 (×2): via INTRAVENOUS

## 2019-07-03 MED ORDER — INSULIN REGULAR(HUMAN) IN NACL 100-0.9 UT/100ML-% IV SOLN
INTRAVENOUS | Status: DC
  Administered 2019-07-03: 12:00:00 9.5 [IU]/h via INTRAVENOUS
  Filled 2019-07-03: qty 100

## 2019-07-03 MED ORDER — DEXTROSE 50 % IV SOLN
0.0000 mL | INTRAVENOUS | Status: DC | PRN
  Administered 2019-07-03: 25 mL via INTRAVENOUS
  Filled 2019-07-03: qty 50

## 2019-07-03 MED ORDER — INSULIN ASPART 100 UNIT/ML ~~LOC~~ SOLN
3.0000 [IU] | Freq: Three times a day (TID) | SUBCUTANEOUS | Status: DC
  Administered 2019-07-03 – 2019-07-06 (×5): 3 [IU] via SUBCUTANEOUS
  Filled 2019-07-03 (×5): qty 1

## 2019-07-03 MED ORDER — SODIUM CHLORIDE 0.9 % IV BOLUS
1000.0000 mL | INTRAVENOUS | Status: AC
  Administered 2019-07-03: 12:00:00 1000 mL via INTRAVENOUS

## 2019-07-03 MED ORDER — DEXTROSE-NACL 5-0.45 % IV SOLN
INTRAVENOUS | Status: DC
  Administered 2019-07-03: 13:00:00 via INTRAVENOUS

## 2019-07-03 NOTE — ED Notes (Signed)
NP Arthur [name] aware of [facility]

## 2019-07-03 NOTE — ED Notes (Signed)
Patient cleaned of stool and new brief and bed pad placed. Covered with warm blankets. Bed alarm in place. Patient resting in bed with eyes closed.

## 2019-07-03 NOTE — ED Notes (Signed)
Date and time results received: 07/03/19 0945 (use smartphrase ".now" to insert current time)  Test: Lactic Acid Critical Value: 3.4  Name of Provider Notified: Dr. Cherylann Banas  Orders Received? Or Actions Taken?: N/A

## 2019-07-03 NOTE — ED Triage Notes (Addendum)
Patient from home via [facility]. Per EMS, they were called by family due to nausea and vomiting for the past few days. Dark brown emesis noted on patient's shirt upon arrival. They also report they have noticed decreased urine output and distended abdomen. Patient arrives with foley catheter in place. Urine in tubing is very thick and cloudy. Family also reports patient's cbg has been in the 400s for the last few days.   Patient with history of dementia and is at baseline per family.    Patient is under at home hospice care.

## 2019-07-03 NOTE — ED Provider Notes (Signed)
[facility] Emergency Department Provider Note ____________________________________________   First MD Initiated Contact with Patient 07/03/19 [phone]     (approximate)  I have reviewed the triage vital signs and the nursing notes.   HISTORY  Chief Complaint Nausea and Emesis  Level 5 caveat: History of present illness limited due to dementia  HPI Arthur [name] is a 73 y.o. male with PMH as noted below who presents from his facility due to nausea and vomiting with dark brown emesis over the last few days.  He has also had distention of the abdomen and decreased urine output from his Foley catheter.  The patient is unable to give any complaints.  Past Medical History:  Diagnosis Date  . Alzheimer disease (HCC)   . Diabetes (HCC)   . Hypertension     Patient Active Problem List   Diagnosis Date Noted  . DKA, type 2 (HCC) 07/03/2019  . Hip fracture (HCC) 12/30/2018    Past Surgical History:  Procedure Laterality Date  . HIP PINNING,CANNULATED Left 12/31/2018   Procedure: CANNULATED HIP PINNING;  Surgeon: Kennedy BuckerMenz, Michael, MD;  Location: [facility] ORS;  Service: Orthopedics;  Laterality: Left;  . KYPHOPLASTY N/A 01/23/2018   Procedure: [id number];  Surgeon: Kennedy BuckerMenz, Michael, MD;  Location: [facility] ORS;  Service: Orthopedics;  Laterality: N/A;    Prior to Admission medications   Medication Sig Start Date End Date Taking? Authorizing Provider  Insulin Detemir (LEVEMIR FLEXTOUCH) 100 UNIT/ML Pen Inject 15 Units into the skin Nightly. 04/17/19  Yes [provider]  atorvastatin (LIPITOR) 10 MG tablet Take 10 mg by mouth daily.    [provider]  bisacodyl (DULCOLAX) 10 MG suppository Place 1 suppository (10 mg total) rectally daily as needed for moderate constipation. 01/04/19   Shaune Pollackhen, Qing, MD  docusate sodium (COLACE) 100 MG capsule Take 1 capsule (100 mg total) by mouth 2 (two) times daily. 01/04/19   Shaune Pollackhen, Qing, MD  glimepiride (AMARYL) 2 MG tablet  Take 2 mg by mouth daily with breakfast. 08/24/18   [provider]  metFORMIN (GLUCOPHAGE) 850 MG tablet Take 850 mg by mouth 2 (two) times daily with a meal.    [provider]  senna-docusate (SENOKOT-S) 8.6-50 MG tablet Take 1 tablet by mouth at bedtime as needed for mild constipation. 01/04/19   Shaune Pollackhen, Qing, MD  sertraline (ZOLOFT) 50 MG tablet Take 100 mg by mouth daily.    [provider]  tamsulosin (FLOMAX) 0.4 MG CAPS capsule Take 1 capsule (0.4 mg total) by mouth daily after supper. 01/04/19   Shaune Pollackhen, Qing, MD  vitamin B-12 (CYANOCOBALAMIN) 1000 MCG tablet Take 1,000 mcg by mouth daily.    [provider]    Allergies Patient has no known allergies.  Family History  Problem Relation Age of Onset  . Prostate cancer Neg Hx   . Bladder Cancer Neg Hx   . Kidney cancer Neg Hx     Social History Social History   Tobacco Use  . Smoking status: Former Games developermoker  . Smokeless tobacco: Never Used  Substance Use Topics  . Alcohol use: Not Currently  . Drug use: Never    Review of Systems Level 5 caveat: Unable to obtain review of systems due to dementia    ____________________________________________   PHYSICAL EXAM:  VITAL SIGNS: ED Triage Vitals  Enc Vitals Group     BP 07/03/19 0821 139/83     Pulse Rate 07/03/19 0821 (!) 104     Resp 07/03/19  3382 (!) 21     Temp 07/03/19 0821 98.6 F (37 C)     Temp Source 07/03/19 0821 Oral     SpO2 07/03/19 0821 95 %     Weight 07/03/19 0818 163 lb 2.3 oz (74 kg)     Height 07/03/19 0815 5\' 9"  (1.753 m)     Head Circumference --      Peak Flow --      Pain Score --      Pain Loc --      Pain Edu? --      Excl. in GC? --     Constitutional: Alert and oriented.  Weak appearing but in no acute distress. Eyes: Conjunctivae are normal.  Head: Atraumatic. Nose: No congestion/rhinnorhea. Mouth/Throat: Mucous membranes are dry.   Neck: Normal range of motion.  Cardiovascular: Tachycardic, regular  rhythm. Grossly normal heart sounds.  Good peripheral circulation. Respiratory: Normal respiratory effort.  No retractions. Lungs CTAB. Gastrointestinal: Mild abdominal distention, somewhat firm.  No focal tenderness. Genitourinary: Normal external genitalia. Musculoskeletal: Extremities warm and well perfused.  Neurologic: Motor intact in all extremities. Skin:  Skin is warm and dry. No rash noted. Psychiatric: Calm and cooperative.  ____________________________________________   LABS (all labs ordered are listed, but only abnormal results are displayed)  Labs Reviewed  GLUCOSE, CAPILLARY - Abnormal; Notable for the following components:      Result Value   Glucose-Capillary 428 (*)    All other components within normal limits  CBC - Abnormal; Notable for the following components:   WBC 19.9 (*)    RDW 16.2 (*)    Platelets 418 (*)    All other components within normal limits  URINALYSIS, COMPLETE (UACMP) WITH MICROSCOPIC - Abnormal; Notable for the following components:   Color, Urine YELLOW (*)    APPearance TURBID (*)    Glucose, UA 50 (*)    Hgb urine dipstick SMALL (*)    Ketones, ur 5 (*)    Protein, ur >=300 (*)    Leukocytes,Ua MODERATE (*)    RBC / HPF >50 (*)    WBC, UA >50 (*)    Bacteria, UA MANY (*)    All other components within normal limits  COMPREHENSIVE METABOLIC PANEL - Abnormal; Notable for the following components:   Sodium 134 (*)    Chloride 96 (*)    CO2 16 (*)    Glucose, Bld 446 (*)    BUN 49 (*)    Creatinine, Ser 2.16 (*)    Total Bilirubin 2.6 (*)    GFR calc non Af Amer 29 (*)    GFR calc Af Amer 33 (*)    Anion gap 22 (*)    All other components within normal limits  LACTIC ACID, PLASMA - Abnormal; Notable for the following components:   Lactic Acid, Venous 3.4 (*)    All other components within normal limits  CULTURE, BLOOD (ROUTINE X 2)  CULTURE, BLOOD (ROUTINE X 2)  SARS CORONAVIRUS 2 (TAT 6-24 HRS)  LIPASE, BLOOD  LACTIC ACID,  PLASMA  BASIC METABOLIC PANEL  BASIC METABOLIC PANEL  BASIC METABOLIC PANEL  BASIC METABOLIC PANEL  BETA-HYDROXYBUTYRIC ACID  BETA-HYDROXYBUTYRIC ACID  HEMOGLOBIN A1C  BLOOD GAS, ARTERIAL  CBC  TROPONIN I (HIGH SENSITIVITY)  TROPONIN I (HIGH SENSITIVITY)   ____________________________________________  EKG  ED ECG REPORT I, , the attending physician, personally viewed and interpreted this ECG.  Date: 07/03/2019 EKG Time: 826 Rate: 106 Rhythm: Sinus tachycardia QRS  Axis: normal Intervals: Borderline prolonged QTc ST/T Wave abnormalities: Nonspecific anterior ST abnormalities Narrative Interpretation: no evidence of acute ischemia  ____________________________________________  RADIOLOGY  CXR: No acute abnormality XR abdomen: Nonobstructive bowel gas pattern CT abdomen: Mild prominence of collecting system bilaterally.  Stercolitis.  No other acute abnormalities.  ____________________________________________   PROCEDURES  Procedure(s) performed: No  Procedures  Critical Care performed: Yes  CRITICAL CARE Performed by: Dionne Bucy   Total critical care time: 30 minutes  Critical care time was exclusive of separately billable procedures and treating other patients.  Critical care was necessary to treat or prevent imminent or life-threatening deterioration.  Critical care was time spent personally by me on the following activities: development of treatment plan with patient and/or surrogate as well as nursing, discussions with consultants, evaluation of patient's response to treatment, examination of patient, obtaining history from patient or surrogate, ordering and performing treatments and interventions, ordering and review of laboratory studies, ordering and review of radiographic studies, pulse oximetry and re-evaluation of patient's condition. ____________________________________________   INITIAL IMPRESSION / ASSESSMENT AND PLAN /  ED COURSE  Pertinent labs & imaging results that were available during my care of the patient were reviewed by me and considered in my medical decision making (see chart for details).  73 year old male with a history of dementia and other PMH as noted above presents with vomiting, abdominal distention, and decreased urination over the last several days.  On exam, the patient is somewhat chronically ill and weak appearing but in no distress.  He is alert.  Neurologic exam is nonfocal.  Mental status is at baseline per EMS.  He does have some abdominal distention.  A Foley catheter is in place although the urine appears cloudy and with sediment.  Mucous membranes are dry.  The remainder of the exam is as described above.  Per EMS, the patient is on hospice although I do not see any specific documentation in Epic of a hospice consult or of the goals of care, so we will further discuss with the family.  Differential includes urinary retention due to obstructed catheter, UTI, constipation, gastroparesis, gastritis, or bowel obstruction.  We will obtain lab work-up, chest and abdominal x-rays, replace the Foley, and reassess.  ----------------------------------------- 11:48 AM on 07/03/2019 -----------------------------------------  Work-up is consistent with a UTI with evidence of sepsis.  I have ordered ceftriaxone based on review of the patient's prior urine cultures from last summer.  The patient also has elevated creatinine from baseline, as well as an elevated glucose and anion gap although only small ketones on the UA.  He is receiving IV fluids.  I obtained a CT abdomen which shows mild prominence of the bilateral collecting systems, likely due to urinary retention that is now resolved after we replaced the Foley.  The patient has had a large volume out from the Foley.  I discussed his care with his daughter who is now present.  She confirms that he is on hospice, but that goals of care  including treatment of reversible conditions including infections.  I then discussed the case with the hospitalist for admission.  __________________________  Arthur [name] was evaluated in Emergency Department on 07/03/2019 for the symptoms described in the history of present illness. He was evaluated in the context of the global COVID-19 pandemic, which necessitated consideration that the patient might be at risk for infection with the SARS-CoV-2 virus that causes COVID-19. Institutional protocols and algorithms that pertain to the evaluation of patients at risk  for COVID-19 are in a state of rapid change based on information released by regulatory bodies including the CDC and federal and state organizations. These policies and algorithms were followed during the patient's care in the ED.  ____________________________________________   FINAL CLINICAL IMPRESSION(S) / ED DIAGNOSES  Final diagnoses:  Abdominal distension  Urinary tract infection without hematuria, site unspecified      NEW MEDICATIONS STARTED DURING THIS VISIT:  New Prescriptions   No medications on file     Note:  This document was prepared using Dragon voice recognition software and may include unintentional dictation errors.    Arta Silence, MD 07/03/19 1149

## 2019-07-03 NOTE — H&P (Addendum)
History and Physical    Arthur [name] YYT:[id number] DOB: May 11, 1946 DOA: 07/03/2019  PCP: [facility] Patient coming from: Home  Chief Complaint: Nausea vomiting  HPI: Arthur [name] is a 73 y.o. male with medical history significant of Alzheimer's dementia, DM 2, BPH, depression, chronic indwelling Foley, on home hospice per EMS was brought to the hospital for evaluation of ongoing nausea and vomiting.  (Symptoms have been ongoing for past 2-3 days, along with increased urine output and slightly distended abdomen.  Chronic Foley catheter was in place with thick cloudy urine.  History is limited, attempted to call patient's POA did not answer therefore left a voicemail.  Patient is a poor historian given advanced dementia.  History per chart review and ER staff.  Currently there is also concern for dark brown emesis as noted on his shirt.  In the ER patient was noted to be septic with UTI, leukocytosis, tachycardia and slight tachypnea.  Started on IV Rocephin reviewing previous cultures.  CT of the abdomen showed mild prominence of bilateral collecting system due to urinary retention and acute pathology.  Labs also showed hyperglycemia with elevated anion gap, acute kidney injury with creatinine of 2.16 up from baseline of 1.0.  ER provider was able to speak with the daughter who confirmed the patient is a hospice and wishes to treat only reversible conditions such as infections.  Medical team was requested to admit the patient.  Foley catheter was changed in the ER.  New Foley has drained greater than 1.2 L of urine.  Seen and examined at the bedside.   Review of Systems: As per HPI otherwise 10 point review of systems negative.  Review of Systems Otherwise negative except as per HPI, including: Difficult to obtain from the patient  Past Medical History:  Diagnosis Date   Alzheimer disease (HCC)    Diabetes (HCC)    Hypertension     Past Surgical History:    Procedure Laterality Date   HIP PINNING,CANNULATED Left 12/31/2018   Procedure: CANNULATED HIP PINNING;  Surgeon: Kennedy Bucker, MD;  Location: [facility] ORS;  Service: Orthopedics;  Laterality: Left;   KYPHOPLASTY N/A 01/23/2018   Procedure: [id number];  Surgeon: Kennedy Bucker, MD;  Location: [facility] ORS;  Service: Orthopedics;  Laterality: N/A;    SOCIAL HISTORY:  reports that he has quit smoking. He has never used smokeless tobacco. He reports previous alcohol use. He reports that he does not use drugs.  No Known Allergies  FAMILY HISTORY: Family History  Problem Relation Age of Onset   Prostate cancer Neg Hx    Bladder Cancer Neg Hx    Kidney cancer Neg Hx      Prior to Admission medications   Medication Sig Start Date End Date Taking? Authorizing Provider  Insulin Detemir (LEVEMIR FLEXTOUCH) 100 UNIT/ML Pen Inject 15 Units into the skin Nightly. 04/17/19  Yes [provider]  atorvastatin (LIPITOR) 10 MG tablet Take 10 mg by mouth daily.    [provider]  bisacodyl (DULCOLAX) 10 MG suppository Place 1 suppository (10 mg total) rectally daily as needed for moderate constipation. 01/04/19   Shaune Pollack, MD  docusate sodium (COLACE) 100 MG capsule Take 1 capsule (100 mg total) by mouth 2 (two) times daily. 01/04/19   Shaune Pollack, MD  glimepiride (AMARYL) 2 MG tablet Take 2 mg by mouth daily with breakfast. 08/24/18   [provider]  metFORMIN (GLUCOPHAGE) 850 MG tablet Take 850 mg by mouth 2 (two) times  daily with a meal.    [provider]  senna-docusate (SENOKOT-S) 8.6-50 MG tablet Take 1 tablet by mouth at bedtime as needed for mild constipation. 01/04/19   Shaune Pollackhen, Qing, MD  sertraline (ZOLOFT) 50 MG tablet Take 100 mg by mouth daily.    [provider]  tamsulosin (FLOMAX) 0.4 MG CAPS capsule Take 1 capsule (0.4 mg total) by mouth daily after supper. 01/04/19   Shaune Pollackhen, Qing, MD  vitamin B-12 (CYANOCOBALAMIN) 1000 MCG tablet Take 1,000 mcg by  mouth daily.    [provider]    Physical Exam: Vitals:   07/03/19 0830 07/03/19 0930 07/03/19 1130 07/03/19 1145  BP: 139/88 102/60 127/71   Pulse: (!) 106 91 95 (!) 101  Resp: (!) 25 13  12   Temp:      TempSrc:      SpO2: 97% 94% 94% 96%  Weight:      Height:          Constitutional: Appears chronically ill and frail, mucous membrane appears dry, remains nonverbal Neck: normal, supple, no masses, no thyromegaly Respiratory: Basilar rhonchi Cardiovascular: Slight sinus tachycardia Abdomen: None tender nondistended, positive bowel sounds Musculoskeletal: Extremities are warm and well perfused Skin: Left-sided temporal/facial ulceration, bilateral lower extremity calluses.  Left lower extremity diabetic foot ulcer-noninfected Neurologic: CN 2-12 grossly intact.  No focal neuro deficits.  Moving all the extremities. Psychiatric: Poor judgment and insight    Labs on Admission: I have personally reviewed following labs and imaging studies  CBC: Recent Labs  Lab 07/03/19 0821  WBC 19.9*  HGB 13.7  HCT 40.4  MCV 87.1  PLT 418*   Basic Metabolic Panel: Recent Labs  Lab 07/03/19 0917  NA 134*  K 4.0  CL 96*  CO2 16*  GLUCOSE 446*  BUN 49*  CREATININE 2.16*  CALCIUM 9.2   GFR: Estimated Creatinine Clearance: 29.5 mL/min (A) (by C-G formula based on SCr of 2.16 mg/dL (H)). Liver Function Tests: Recent Labs  Lab 07/03/19 0917  AST 21  ALT 13  ALKPHOS 92  BILITOT 2.6*  PROT 7.8  ALBUMIN 3.9   Recent Labs  Lab 07/03/19 0917  LIPASE 19   No results for input(s): AMMONIA in the last 168 hours. Coagulation Profile: No results for input(s): INR, PROTIME in the last 168 hours. Cardiac Enzymes: No results for input(s): CKTOTAL, CKMB, CKMBINDEX, TROPONINI in the last 168 hours. BNP (last 3 results) No results for input(s): PROBNP in the last 8760 hours. HbA1C: No results for input(s): HGBA1C in the last 72 hours. CBG: Recent Labs  Lab  07/03/19 0827  GLUCAP 428*   Lipid Profile: No results for input(s): CHOL, HDL, LDLCALC, TRIG, CHOLHDL, LDLDIRECT in the last 72 hours. Thyroid Function Tests: No results for input(s): TSH, T4TOTAL, FREET4, T3FREE, THYROIDAB in the last 72 hours. Anemia Panel: No results for input(s): VITAMINB12, FOLATE, FERRITIN, TIBC, IRON, RETICCTPCT in the last 72 hours. Urine analysis:    Component Value Date/Time   COLORURINE YELLOW (A) 07/03/2019 0917   APPEARANCEUR TURBID (A) 07/03/2019 0917   APPEARANCEUR Clear 10/27/2012 0429   LABSPEC 1.019 07/03/2019 0917   LABSPEC 1.016 10/27/2012 0429   PHURINE 7.0 07/03/2019 0917   GLUCOSEU 50 (A) 07/03/2019 0917   GLUCOSEU 150 mg/dL 54/09/811903/28/2014 [id number]   HGBUR SMALL (A) 07/03/2019 0917   BILIRUBINUR NEGATIVE 07/03/2019 0917   BILIRUBINUR Negative 10/27/2012 0429   KETONESUR 5 (A) 07/03/2019 0917   PROTEINUR >=300 (A) 07/03/2019 0917   NITRITE NEGATIVE 07/03/2019  [place] (A) 07/03/2019 0917   LEUKOCYTESUR Negative 10/27/2012 0429   Sepsis Labs: !!!!!!!!!!!!!!!!!!!!!!!!!!!!!!!!!!!!!!!!!!!! @LABRCNTIP (procalcitonin:4,lacticidven:4) )No results found for this or any previous visit (from the past 240 hour(s)).   Radiological Exams on Admission: Ct Abdomen Pelvis Wo Contrast  Result Date: 07/03/2019 CLINICAL DATA:  Nausea and vomiting and decreased urinary output EXAM: CT ABDOMEN AND PELVIS WITHOUT CONTRAST TECHNIQUE: Multidetector CT imaging of the abdomen and pelvis was performed following the standard protocol without IV contrast. COMPARISON:  01/12/18 FINDINGS: Lower chest: Stable dependent atelectatic changes are noted. Coronary calcifications are noted. Hepatobiliary: No focal liver abnormality is seen. No gallstones, gallbladder wall thickening, or biliary dilatation. Pancreas: Unremarkable. No pancreatic ductal dilatation or surrounding inflammatory changes. Spleen: Scattered calcified granulomas are noted. Adrenals/Urinary  Tract: Adrenal glands again demonstrate stable adenoma similar to that seen on the prior exam. The kidneys show prominent extrarenal pelves bilaterally. The ureters are mildly prominent right greater than left. There is a calcification within the bladder just beyond/adjacent to the left UVJ. This may represent a recently passed stone or small bladder calculus. Air is noted within the bladder from the Foley catheter. The bladder is decompressed. Stomach/Bowel: Mild changes of rectal impaction are seen. Some rectal wall thickening is noted which may represent a degree of stercoral colitis. The more proximal colon shows scattered fecal material without obstructive change. The appendix is within normal limits. Small bowel appears within normal limits. Vascular/Lymphatic: Aortic atherosclerosis. No enlarged abdominal or pelvic lymph nodes. Reproductive: Prostate is unremarkable. Other: Mild free fluid is noted along the pericolic gutters right greater than left. This is removed from the appendix and not related to appendiceal inflammation. Musculoskeletal: Postsurgical changes in the left hip are noted. Degenerative changes of lumbar spine are noted. Changes of prior L1 vertebral augmentation are noted. IMPRESSION: Mild prominence of the collecting systems bilaterally. There is a calcification near the left UVJ within the bladder which could be related to recently passed stone although may represent a bladder calculus. Foley catheter decompresses the bladder. Changes suggestive of stercoral colitis within the rectum. Mild fluid attenuation is noted in the pericolic gutters bilaterally without focal fluid collection. This is likely reactive in nature. Normal-appearing appendix. Electronically Signed   By: Inez Catalina M.D.   On: 07/03/2019 10:44   Dg Chest Portable 1 View  Result Date: 07/03/2019 CLINICAL DATA:  Weakness, tachycardia, nausea, vomiting, abdominal distension EXAM: PORTABLE CHEST 1 VIEW COMPARISON:   Portable exam 0833 hours compared to 01/05/2018 FINDINGS: Normal heart size, mediastinal contours, and pulmonary vascularity. Atherosclerotic calcification aorta. Skin folds project over chest bilaterally. No acute infiltrate, pleural effusion, or pneumothorax. Bones demineralized. IMPRESSION: No acute abnormalities. Aortic Atherosclerosis (ICD10-I70.0). Electronically Signed   By: Lavonia Dana M.D.   On: 07/03/2019 09:01   Dg Abd 2 Views  Result Date: 07/03/2019 CLINICAL DATA:  Nausea, vomiting. EXAM: ABDOMEN - 2 VIEW COMPARISON:  None. FINDINGS: The bowel gas pattern is normal. Mild amount of stool is noted in the colon and rectum. There is no evidence of free air. No radio-opaque calculi or other significant radiographic abnormality is seen. IMPRESSION: No definite evidence of bowel obstruction or ileus. Electronically Signed   By: Marijo Conception M.D.   On: 07/03/2019 09:01     All images have been reviewed by me personally.  EKG: Independently reviewed.  Sinus tachycardia  Assessment/Plan Principal Problem:   DKA, type 2 (HCC) Active Problems:   Sepsis secondary to UTI [facility])   Alzheimer's dementia (  HCC)   Nausea & vomiting   Depression   BPH (benign prostatic hyperplasia)    Diabetic ketoacidosis, type II History of diabetes mellitus type 2 -Admit the patient for treatment of DKA - We will place the patient on DKA protocol-including insulin drip, fluids and electrolyte protocol -IV fluid for maintenance, will change to D5 once blood sugar falls below 250 - Closely monitor potassium and magnesium -ABG/VBG shows= pending -Periodically check BMP and magnesium -Once the anion gap closes and patient is able to tolerate oral, start patient on diabetic diet and transition to long-acting subcutaneous insulin.  Turn off the drip after 1-2 hours of getting subcu insulin. Closely monitor urine output -Antiemetics as necessary -Check hemoglobin A1c  Sepsis secondary to urinary tract  infection, CAUTI.  POA -Leukocytosis, lactic acidosis, tachycardia, tachypnea and clear source of infection.  Sepsis protocol.  Cultures ordered.  Follow-up urine culture -Previous urine cultures grew Klebsiella sensitive to IV Rocephin.  Continue IV Rocephin for now. -Supportive care, monitor urine output.  Acute kidney injury on CKD stage II -Baseline creatinine 1.0.  Admission creatinine 2.1 -Improved with hydration  Chronic urinary retention with Foley BPH -Follows outpatient urology.  Foley catheter changed in the ER.  Alzheimer's dementia -Supportive care.  On outpatient home hospice  History of depression -Continue home meds  Preadmission med rec is still pending.  Requested pharmacy to complete this.   DVT prophylaxis: Subcu heparin Code Status: DNR Family Communication: Attempted to call his daughter, POA.  Did not answer therefore left a voicemail requesting a call back Disposition Plan: To be determined Consults called: None Admission status: Admit to stepdown for treatment of DKA and sepsis due to UTI   Time Spent: 65 minutes.  >50% of the time was devoted to discussing the patients care, assessment, plan and disposition with other care givers along with counseling the patient about the risks and benefits of treatment.    Arthur [name] Joline Maxcy MD [facility]  If 7PM-7AM, please contact night-coverage   07/03/2019, 12:05 PM

## 2019-07-03 NOTE — ED Notes (Signed)
69F coude catheter removed from patient. Patient cleaned well and new catheter inserted. Immediate return of urine noted.

## 2019-07-04 ENCOUNTER — Other Ambulatory Visit: Payer: Self-pay

## 2019-07-04 LAB — COMPREHENSIVE METABOLIC PANEL
ALT: 11 U/L (ref 0–44)
AST: 13 U/L — ABNORMAL LOW (ref 15–41)
Albumin: 3 g/dL — ABNORMAL LOW (ref 3.5–5.0)
Alkaline Phosphatase: 57 U/L (ref 38–126)
Anion gap: 9 (ref 5–15)
BUN: 30 mg/dL — ABNORMAL HIGH (ref 8–23)
CO2: 23 mmol/L (ref 22–32)
Calcium: 8.5 mg/dL — ABNORMAL LOW (ref 8.9–10.3)
Chloride: 108 mmol/L (ref 98–111)
Creatinine, Ser: 0.85 mg/dL (ref 0.61–1.24)
GFR calc Af Amer: >60 mL/min (ref >60)
GFR calc non Af Amer: >60 mL/min (ref >60)
Glucose, Bld: 126 mg/dL — ABNORMAL HIGH (ref 70–99)
Potassium: 3 mmol/L — ABNORMAL LOW (ref 3.5–5.1)
Sodium: 140 mmol/L (ref 135–145)
Total Bilirubin: 1.5 mg/dL — ABNORMAL HIGH (ref 0.3–1.2)
Total Protein: 6 g/dL — ABNORMAL LOW (ref 6.5–8.1)

## 2019-07-04 LAB — LACTIC ACID, PLASMA
Lactic Acid, Venous: 1.6 mmol/L (ref 0.5–1.9)
Lactic Acid, Venous: 1.5 mmol/L (ref 0.5–1.9)

## 2019-07-04 LAB — CBC
HCT: 31.1 % — ABNORMAL LOW (ref 39.0–52.0)
Hemoglobin: 10.3 g/dL — ABNORMAL LOW (ref 13.0–17.0)
MCH: 29.4 pg (ref 26.0–34.0)
MCHC: 33.1 g/dL (ref 30.0–36.0)
MCV: 88.9 fL (ref 80.0–100.0)
Platelets: 300 10*3/uL (ref 150–400)
RBC: 3.5 MIL/uL — ABNORMAL LOW (ref 4.22–5.81)
RDW: 16.1 % — ABNORMAL HIGH (ref 11.5–15.5)
WBC: 13.9 10*3/uL — ABNORMAL HIGH (ref 4.0–10.5)
nRBC: 0 % (ref 0.0–0.2)

## 2019-07-04 LAB — GLUCOSE, CAPILLARY
Glucose-Capillary: 100 mg/dL — ABNORMAL HIGH (ref 70–99)
Glucose-Capillary: 103 mg/dL — ABNORMAL HIGH (ref 70–99)
Glucose-Capillary: 121 mg/dL — ABNORMAL HIGH (ref 70–99)
Glucose-Capillary: 140 mg/dL — ABNORMAL HIGH (ref 70–99)
Glucose-Capillary: 152 mg/dL — ABNORMAL HIGH (ref 70–99)
Glucose-Capillary: 157 mg/dL — ABNORMAL HIGH (ref 70–99)

## 2019-07-04 LAB — MAGNESIUM: Magnesium: 1.7 mg/dL (ref 1.7–2.4)

## 2019-07-04 LAB — POTASSIUM: Potassium: 3 mmol/L — ABNORMAL LOW (ref 3.5–5.1)

## 2019-07-04 MED ORDER — POTASSIUM CHLORIDE IN NACL 20-0.9 MEQ/L-% IV SOLN
INTRAVENOUS | Status: DC
  Administered 2019-07-04 – 2019-07-05 (×3): via INTRAVENOUS
  Filled 2019-07-04 (×4): qty 1000

## 2019-07-04 MED ORDER — CHLORHEXIDINE GLUCONATE CLOTH 2 % EX PADS
6.0000 | MEDICATED_PAD | Freq: Every day | CUTANEOUS | Status: DC
  Administered 2019-07-04 – 2019-07-06 (×3): 6 via TOPICAL

## 2019-07-04 MED ORDER — INSULIN ASPART 100 UNIT/ML ~~LOC~~ SOLN: 0.0000 [IU] | Freq: Every day | SUBCUTANEOUS | Status: DC

## 2019-07-04 MED ORDER — POTASSIUM CHLORIDE 10 MEQ/100ML IV SOLN
10.0000 meq | INTRAVENOUS | Status: AC
  Administered 2019-07-04 (×6): 10 meq via INTRAVENOUS
  Filled 2019-07-04 (×6): qty 100

## 2019-07-04 MED ORDER — KCL IN DEXTROSE-NACL 20-5-0.9 MEQ/L-%-% IV SOLN: INTRAVENOUS | Status: DC

## 2019-07-04 MED ORDER — MAGNESIUM SULFATE 2 GM/50ML IV SOLN
2.0000 g | Freq: Once | INTRAVENOUS | Status: AC
  Administered 2019-07-04: 14:00:00 2 g via INTRAVENOUS
  Filled 2019-07-04: qty 50

## 2019-07-04 NOTE — ED Notes (Addendum)
Pt given meal tray at this time. Pt moved to hospital bed for comfort. IV re-initiated to L forearm by this RN due to patient pulling both IV's, both IV's noted to be in patient's bed at this time. Pt is currently in NAD at this time, Repositioned in bed, placed with clean sheets. Pt remains pleasant but confused. VS stable. Will continue to monitor for further patient needs.

## 2019-07-04 NOTE — ED Notes (Signed)
Mittens placed to bilateral hands due to patient attempting to pull out his IV again. New dressing placed to L forearm by this RN. Pt tolerated well. Will continue to monitor for further patient needs. Pt remains in hospital bed at this time.

## 2019-07-04 NOTE — ED Notes (Signed)
Per Caryl Pina, RN pt needs repeat lactic prior to coming to floor. This RN messaged Dr. Izetta Dakin regarding recollection prior to patient going to the floor.

## 2019-07-04 NOTE — Progress Notes (Signed)
Brief HX:  73 y.o. male with medical history significant of Alzheimer's dementia, DM 2, BPH, depression, chronic indwelling Foley, on home hospice per EMS was brought to the hospital for evaluation of ongoing nausea and vomiting. History is limited, attempted to call patient's POA did not answer therefore left a voicemail.  Patient is a poor historian given advanced dementia.  History per chart review and ER staff. Currently there is also concern for dark brown emesis as noted on his shirt. In the ER patient was noted to be septic with UTI, leukocytosis, tachycardia and slight tachypnea.  Started on IV Rocephin reviewing previous cultures.  CT of the abdomen showed mild prominence of bilateral collecting system due to urinary retention and acute pathology.  Labs also showed hyperglycemia with elevated anion gap, acute kidney injury with creatinine of 2.16 up from baseline of 1.0.  ER provider was able to speak with the daughter who confirmed the patient is a hospice and wishes to treat only reversible conditions such as infections. Medical team was requested to admit the patient. Foley catheter was changed in the ER.  New Foley has drained greater than 1.2 L of urine.    Subjective: Stable from admission.  Appears to be confused (may be his baseline) but alert.  Objective: Vital signs in last 24 hours: Pulse Rate:  [64-100] 79 (12/02 1130) Resp:  [11-26] 23 (12/02 1130) BP: (81-117)/(48-74) 105/66 (12/02 1130) SpO2:  [90 %-98 %] 97 % (12/02 1130)  Intake/Output from previous day: 12/01 0701 - 12/02 0700 In: 496.7 [I.V.:496.7] Out: 3325 [Urine:3325] Intake/Output this shift: Total I/O In: 100 [IV Piggyback:100] Out: 700 [Urine:700]  PEx:  Constitutional:  mucous membrane appears dry, remains nonverbal Neck: normal, supple, no masses, no thyromegaly Respiratory: Basilar rhonchi Cardiovascular: Slight sinus tachycardia Abdomen: None tender nondistended, positive bowel  sounds Musculoskeletal: Extremities are warm and well perfused Skin: Left-sided temporal/facial ulceration, bilateral lower extremity calluses.  Left lower extremity diabetic foot ulcer-noninfected Neurologic: No focal neuro deficits.  Moving all the extremities. Psychiatric: Poor judgment and insight  Results for orders placed or performed during the hospital encounter of 07/03/19 (from the past 24 hour(s))  Glucose, capillary     Status: Abnormal   Collection Time: 07/03/19  1:21 PM  Result Value Ref Range   Glucose-Capillary 164 (H) 70 - 99 mg/dL  Lactic acid, plasma     Status: Abnormal   Collection Time: 07/03/19  2:24 PM  Result Value Ref Range   Lactic Acid, Venous 3.0 (HH) 0.5 - 1.9 mmol/L  Troponin I (High Sensitivity)     Status: Abnormal   Collection Time: 07/03/19  2:24 PM  Result Value Ref Range   Troponin I (High Sensitivity) 18 (H) <18 ng/L  Basic metabolic panel     Status: Abnormal   Collection Time: 07/03/19  2:24 PM  Result Value Ref Range   Sodium 140 135 - 145 mmol/L   Potassium 3.1 (L) 3.5 - 5.1 mmol/L   Chloride 104 98 - 111 mmol/L   CO2 21 (L) 22 - 32 mmol/L   Glucose, Bld 191 (H) 70 - 99 mg/dL   BUN 40 (H) 8 - 23 mg/dL   Creatinine, Ser 7.82 (H) 0.61 - 1.24 mg/dL   Calcium 8.6 (L) 8.9 - 10.3 mg/dL   GFR calc non Af Amer 47 (L) >60 mL/min   GFR calc Af Amer 55 (L) >60 mL/min   Anion gap 15 5 - 15  Beta-hydroxybutyric acid     Status:  Abnormal   Collection Time: 07/03/19  2:24 PM  Result Value Ref Range   Beta-Hydroxybutyric Acid 0.77 (H) 0.05 - 0.27 mmol/L  Hemoglobin A1c     Status: Abnormal   Collection Time: 07/03/19  2:24 PM  Result Value Ref Range   Hgb A1c MFr Bld 10.5 (H) 4.8 - 5.6 %   Mean Plasma Glucose 254.65 mg/dL  CBC     Status: Abnormal   Collection Time: 07/03/19  2:24 PM  Result Value Ref Range   WBC 16.0 (H) 4.0 - 10.5 K/uL   RBC 3.53 (L) 4.22 - 5.81 MIL/uL   Hemoglobin 10.5 (L) 13.0 - 17.0 g/dL   HCT 81.1 (L) 91.4 - 78.2 %    MCV 89.0 80.0 - 100.0 fL   MCH 29.7 26.0 - 34.0 pg   MCHC 33.4 30.0 - 36.0 g/dL   RDW 95.6 (H) 21.3 - 08.6 %   Platelets 338 150 - 400 K/uL   nRBC 0.0 0.0 - 0.2 %  Glucose, capillary     Status: Abnormal   Collection Time: 07/03/19  2:34 PM  Result Value Ref Range   Glucose-Capillary 121 (H) 70 - 99 mg/dL  Blood gas, venous     Status: Abnormal   Collection Time: 07/03/19  2:43 PM  Result Value Ref Range   pH, Ven 7.41 7.250 - 7.430   pCO2, Ven 37 (L) 44.0 - 60.0 mmHg   pO2, Ven 35.0 32.0 - 45.0 mmHg   Bicarbonate 23.5 20.0 - 28.0 mmol/L   Acid-base deficit 0.8 0.0 - 2.0 mmol/L   O2 Saturation 68.0 %   Patient temperature 37.0    Collection site VENOUS    Sample type VENOUS   Glucose, capillary     Status: Abnormal   Collection Time: 07/03/19  4:38 PM  Result Value Ref Range   Glucose-Capillary 139 (H) 70 - 99 mg/dL  Glucose, capillary     Status: Abnormal   Collection Time: 07/03/19  5:48 PM  Result Value Ref Range   Glucose-Capillary 157 (H) 70 - 99 mg/dL  Basic metabolic panel     Status: Abnormal   Collection Time: 07/03/19  6:13 PM  Result Value Ref Range   Sodium 138 135 - 145 mmol/L   Potassium 2.9 (L) 3.5 - 5.1 mmol/L   Chloride 103 98 - 111 mmol/L   CO2 24 22 - 32 mmol/L   Glucose, Bld 170 (H) 70 - 99 mg/dL   BUN 37 (H) 8 - 23 mg/dL   Creatinine, Ser 5.78 0.61 - 1.24 mg/dL   Calcium 8.7 (L) 8.9 - 10.3 mg/dL   GFR calc non Af Amer 57 (L) >60 mL/min   GFR calc Af Amer >60 >60 mL/min   Anion gap 11 5 - 15  Glucose, capillary     Status: Abnormal   Collection Time: 07/03/19  6:58 PM  Result Value Ref Range   Glucose-Capillary 103 (H) 70 - 99 mg/dL  Glucose, capillary     Status: Abnormal   Collection Time: 07/03/19  8:15 PM  Result Value Ref Range   Glucose-Capillary 45 (L) 70 - 99 mg/dL  Glucose, capillary     Status: Abnormal   Collection Time: 07/03/19  9:47 PM  Result Value Ref Range   Glucose-Capillary 121 (H) 70 - 99 mg/dL  Beta-hydroxybutyric acid      Status: None   Collection Time: 07/03/19 10:17 PM  Result Value Ref Range   Beta-Hydroxybutyric Acid 0.24 0.05 - 0.27  mmol/L  Basic metabolic panel     Status: Abnormal   Collection Time: 07/03/19 10:17 PM  Result Value Ref Range   Sodium 139 135 - 145 mmol/L   Potassium 3.0 (L) 3.5 - 5.1 mmol/L   Chloride 105 98 - 111 mmol/L   CO2 24 22 - 32 mmol/L   Glucose, Bld 129 (H) 70 - 99 mg/dL   BUN 35 (H) 8 - 23 mg/dL   Creatinine, Ser 7.841.08 0.61 - 1.24 mg/dL   Calcium 8.5 (L) 8.9 - 10.3 mg/dL   GFR calc non Af Amer >60 >60 mL/min   GFR calc Af Amer >60 >60 mL/min   Anion gap 10 5 - 15  Glucose, capillary     Status: Abnormal   Collection Time: 07/03/19 10:51 PM  Result Value Ref Range   Glucose-Capillary 135 (H) 70 - 99 mg/dL  Glucose, capillary     Status: Abnormal   Collection Time: 07/04/19 12:13 AM  Result Value Ref Range   Glucose-Capillary 140 (H) 70 - 99 mg/dL  Glucose, capillary     Status: Abnormal   Collection Time: 07/04/19  4:12 AM  Result Value Ref Range   Glucose-Capillary 152 (H) 70 - 99 mg/dL  Comprehensive metabolic panel     Status: Abnormal   Collection Time: 07/04/19  7:07 AM  Result Value Ref Range   Sodium 140 135 - 145 mmol/L   Potassium 3.0 (L) 3.5 - 5.1 mmol/L   Chloride 108 98 - 111 mmol/L   CO2 23 22 - 32 mmol/L   Glucose, Bld 126 (H) 70 - 99 mg/dL   BUN 30 (H) 8 - 23 mg/dL   Creatinine, Ser 6.960.85 0.61 - 1.24 mg/dL   Calcium 8.5 (L) 8.9 - 10.3 mg/dL   Total Protein 6.0 (L) 6.5 - 8.1 g/dL   Albumin 3.0 (L) 3.5 - 5.0 g/dL   AST 13 (L) 15 - 41 U/L   ALT 11 0 - 44 U/L   Alkaline Phosphatase 57 38 - 126 U/L   Total Bilirubin 1.5 (H) 0.3 - 1.2 mg/dL   GFR calc non Af Amer >60 >60 mL/min   GFR calc Af Amer >60 >60 mL/min   Anion gap 9 5 - 15  CBC     Status: Abnormal   Collection Time: 07/04/19  7:07 AM  Result Value Ref Range   WBC 13.9 (H) 4.0 - 10.5 K/uL   RBC 3.50 (L) 4.22 - 5.81 MIL/uL   Hemoglobin 10.3 (L) 13.0 - 17.0 g/dL   HCT 29.531.1 (L)  28.439.0 - 52.0 %   MCV 88.9 80.0 - 100.0 fL   MCH 29.4 26.0 - 34.0 pg   MCHC 33.1 30.0 - 36.0 g/dL   RDW 13.216.1 (H) 44.011.5 - 10.215.5 %   Platelets 300 150 - 400 K/uL   nRBC 0.0 0.0 - 0.2 %  Magnesium     Status: None   Collection Time: 07/04/19  7:07 AM  Result Value Ref Range   Magnesium 1.7 1.7 - 2.4 mg/dL  Glucose, capillary     Status: Abnormal   Collection Time: 07/04/19  9:04 AM  Result Value Ref Range   Glucose-Capillary 100 (H) 70 - 99 mg/dL  Glucose, capillary     Status: Abnormal   Collection Time: 07/04/19 12:32 PM  Result Value Ref Range   Glucose-Capillary 157 (H) 70 - 99 mg/dL    Studies/Results: Ct Abdomen Pelvis Wo Contrast  Result Date: 07/03/2019 CLINICAL DATA:  Nausea  and vomiting and decreased urinary output EXAM: CT ABDOMEN AND PELVIS WITHOUT CONTRAST TECHNIQUE: Multidetector CT imaging of the abdomen and pelvis was performed following the standard protocol without IV contrast. COMPARISON:  01/12/18 FINDINGS: Lower chest: Stable dependent atelectatic changes are noted. Coronary calcifications are noted. Hepatobiliary: No focal liver abnormality is seen. No gallstones, gallbladder wall thickening, or biliary dilatation. Pancreas: Unremarkable. No pancreatic ductal dilatation or surrounding inflammatory changes. Spleen: Scattered calcified granulomas are noted. Adrenals/Urinary Tract: Adrenal glands again demonstrate stable adenoma similar to that seen on the prior exam. The kidneys show prominent extrarenal pelves bilaterally. The ureters are mildly prominent right greater than left. There is a calcification within the bladder just beyond/adjacent to the left UVJ. This may represent a recently passed stone or small bladder calculus. Air is noted within the bladder from the Foley catheter. The bladder is decompressed. Stomach/Bowel: Mild changes of rectal impaction are seen. Some rectal wall thickening is noted which may represent a degree of stercoral colitis. The more proximal colon  shows scattered fecal material without obstructive change. The appendix is within normal limits. Small bowel appears within normal limits. Vascular/Lymphatic: Aortic atherosclerosis. No enlarged abdominal or pelvic lymph nodes. Reproductive: Prostate is unremarkable. Other: Mild free fluid is noted along the pericolic gutters right greater than left. This is removed from the appendix and not related to appendiceal inflammation. Musculoskeletal: Postsurgical changes in the left hip are noted. Degenerative changes of lumbar spine are noted. Changes of prior L1 vertebral augmentation are noted. IMPRESSION: Mild prominence of the collecting systems bilaterally. There is a calcification near the left UVJ within the bladder which could be related to recently passed stone although may represent a bladder calculus. Foley catheter decompresses the bladder. Changes suggestive of stercoral colitis within the rectum. Mild fluid attenuation is noted in the pericolic gutters bilaterally without focal fluid collection. This is likely reactive in nature. Normal-appearing appendix. Electronically Signed   By: Inez Catalina M.D.   On: 07/03/2019 10:44   Dg Chest Portable 1 View  Result Date: 07/03/2019 CLINICAL DATA:  Weakness, tachycardia, nausea, vomiting, abdominal distension EXAM: PORTABLE CHEST 1 VIEW COMPARISON:  Portable exam 0833 hours compared to 01/05/2018 FINDINGS: Normal heart size, mediastinal contours, and pulmonary vascularity. Atherosclerotic calcification aorta. Skin folds project over chest bilaterally. No acute infiltrate, pleural effusion, or pneumothorax. Bones demineralized. IMPRESSION: No acute abnormalities. Aortic Atherosclerosis (ICD10-I70.0). Electronically Signed   By: Lavonia Dana M.D.   On: 07/03/2019 09:01   Dg Abd 2 Views  Result Date: 07/03/2019 CLINICAL DATA:  Nausea, vomiting. EXAM: ABDOMEN - 2 VIEW COMPARISON:  None. FINDINGS: The bowel gas pattern is normal. Mild amount of stool is noted in  the colon and rectum. There is no evidence of free air. No radio-opaque calculi or other significant radiographic abnormality is seen. IMPRESSION: No definite evidence of bowel obstruction or ileus. Electronically Signed   By: Marijo Conception M.D.   On: 07/03/2019 09:01    Scheduled Meds: . heparin  5,000 Units Subcutaneous Q8H  . insulin aspart  0-15 Units Subcutaneous TID WC  . insulin aspart  0-5 Units Subcutaneous QHS  . insulin aspart  3 Units Subcutaneous TID WC  . insulin glargine  15 Units Subcutaneous Daily   Continuous Infusions: . sodium chloride 75 mL/hr at 07/04/19 1131  . cefTRIAXone (ROCEPHIN)  IV 1 g (07/04/19 1133)  . dextrose 5 % and 0.45% NaCl Stopped (07/04/19 1042)  . magnesium sulfate bolus IVPB    . potassium chloride  10 mEq (07/04/19 1132)   PRN Meds:dextrose, hydrALAZINE  Assessment/Plan: Principal Problem:   DKA, type 2 (HCC) Active Problems:   Sepsis secondary to UTI (HCC)   Alzheimer's dementia (HCC)   Nausea & vomiting   Depression   BPH (benign prostatic hyperplasia)    Diabetic ketoacidosis, type II History of diabetes mellitus type 2 - Was on DKA protocol-including insulin drip, fluids and electrolyte protocol -IV fluid for maintenance, will change to D5 once blood sugar falls below 250 - Closely monitor potassium and magnesium -ABG stable  -Periodically check BMP and magnesium --> Replace  -Once the anion gap closes and patient is able to tolerate oral, start patient on diabetic diet and transition to long-acting subcutaneous insulin.  Turn off the drip after 1-2 hours of getting subcu insulin. Closely monitor urine output -Antiemetics as necessary - Hemoglobin A1c 10.5  Sepsis secondary to urinary tract infection, CAUTI.  POA -On admission, Leukocytosis, lactic acidosis, tachycardia, tachypnea and clear source of infection.   - Cont Sepsis protocol.  Cultures ordered --> Pending  -  Follow-up urine culture -- P -Previous urine cultures  grew Klebsiella sensitive to IV Rocephin.  Continue IV Rocephin for now. -Supportive care, monitor urine output.  Electrolyte imbalance: Likely from DKA -Status post replacement -Also on receiving KCl with normal saline Recheck  Acute kidney injury on CKD stage II -Improved  - Baseline creatinine 1.0.  Admission creatinine 2.1 -Improved with hydration  Chronic urinary retention with Foley BPH -Follows outpatient urology.  Foley catheter changed in the ER.  Alzheimer's dementia -Supportive care.  On outpatient home hospice  History of depression -Continue home meds  Diabetic foot ulcer: Chronic -No acute issue at this time -Has pressure bandage on; no discharge or tenderness or complaints at this time -Tinea to monitor  DVT prophylaxis: Subcu heparin Code Status: DNR Family Communication:  Family updated     LOS: 1 day   [employer]

## 2019-07-04 NOTE — ED Notes (Signed)
Caryl Pina, RN made aware of repeat lactic result. Report given on patient. Pt to be transported to the floor at this time.

## 2019-07-04 NOTE — ED Notes (Signed)
Per Dr. Izetta Dakin repeat K to be drawn after total of 6 runs of potassium.

## 2019-07-04 NOTE — ED Notes (Signed)
This RN spoke with Dr. Izetta Dakin regarding the total runs of potassium and magnesium due to patient no longer being insulin drip and repeat K with 0700 labs being 3 and repeat mag being 1.7. Per Admitting MD, administer medications as ordered and repeat K level after runs of K are complete. Pharmacy called regarding remaining 4 runs of potassium at this time.

## 2019-07-04 NOTE — ED Notes (Signed)
Hospital bed requested for patient comfort at this time.

## 2019-07-04 NOTE — ED Notes (Signed)
Pt transported to floor by Joan Mayans RN.

## 2019-07-04 NOTE — ED Notes (Signed)
This RN to bedside at this time, pt's foley emptied, approx 700 cc's of yellow fould smelling urine emptied from bag. Introduced self to patient, pt denies any needs at this time.

## 2019-07-05 LAB — BASIC METABOLIC PANEL
Anion gap: 7 (ref 5–15)
BUN: 14 mg/dL (ref 8–23)
CO2: 26 mmol/L (ref 22–32)
Calcium: 8.3 mg/dL — ABNORMAL LOW (ref 8.9–10.3)
Chloride: 107 mmol/L (ref 98–111)
Creatinine, Ser: 0.62 mg/dL (ref 0.61–1.24)
GFR calc Af Amer: >60 mL/min (ref >60)
GFR calc non Af Amer: >60 mL/min (ref >60)
Glucose, Bld: 139 mg/dL — ABNORMAL HIGH (ref 70–99)
Potassium: 3 mmol/L — ABNORMAL LOW (ref 3.5–5.1)
Sodium: 140 mmol/L (ref 135–145)

## 2019-07-05 LAB — GLUCOSE, CAPILLARY
Glucose-Capillary: 136 mg/dL — ABNORMAL HIGH (ref 70–99)
Glucose-Capillary: 148 mg/dL — ABNORMAL HIGH (ref 70–99)
Glucose-Capillary: 161 mg/dL — ABNORMAL HIGH (ref 70–99)
Glucose-Capillary: 179 mg/dL — ABNORMAL HIGH (ref 70–99)

## 2019-07-05 LAB — CBC
HCT: 34.7 % — ABNORMAL LOW (ref 39.0–52.0)
Hemoglobin: 11.7 g/dL — ABNORMAL LOW (ref 13.0–17.0)
MCH: 29.1 pg (ref 26.0–34.0)
MCHC: 33.7 g/dL (ref 30.0–36.0)
MCV: 86.3 fL (ref 80.0–100.0)
Platelets: 315 10*3/uL (ref 150–400)
RBC: 4.02 MIL/uL — ABNORMAL LOW (ref 4.22–5.81)
RDW: 15.7 % — ABNORMAL HIGH (ref 11.5–15.5)
WBC: 10 10*3/uL (ref 4.0–10.5)
nRBC: 0 % (ref 0.0–0.2)

## 2019-07-05 LAB — MAGNESIUM: Magnesium: 1.8 mg/dL (ref 1.7–2.4)

## 2019-07-05 MED ORDER — POTASSIUM CHLORIDE 20 MEQ PO PACK
40.0000 meq | PACK | Freq: Once | ORAL | Status: AC
  Administered 2019-07-05: 15:00:00 40 meq via ORAL
  Filled 2019-07-05: qty 2

## 2019-07-05 MED ORDER — POLYETHYLENE GLYCOL 3350 17 G PO PACK: 17.0000 g | PACK | Freq: Every day | ORAL | Status: DC | PRN

## 2019-07-05 MED ORDER — MAGNESIUM SULFATE 2 GM/50ML IV SOLN
2.0000 g | Freq: Once | INTRAVENOUS | Status: AC
  Administered 2019-07-05: 2 g via INTRAVENOUS
  Filled 2019-07-05: qty 50

## 2019-07-05 MED ORDER — ENOXAPARIN SODIUM 40 MG/0.4ML ~~LOC~~ SOLN
40.0000 mg | SUBCUTANEOUS | Status: DC
  Administered 2019-07-05: 22:00:00 40 mg via SUBCUTANEOUS
  Filled 2019-07-05: qty 0.4

## 2019-07-05 NOTE — Consult Note (Signed)
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  Chronic nonhealing and is not infected at this time.  We will treat conservatively  Wound type:neuropathic Pressure Injury POA: Yes Measurement: 2 cm round Wound VEL:[id number] tissue with circumferential callous noted Drainage (amount, consistency, odor) none noted.  Periwound:callous Dressing procedure/placement/frequency: Cleanse wound to left foot with NS and pat gently dry.  Paint with betadine daily.  COver with foam dressing to pad and protect. Will not follow at this time.  Please re-consult if needed.  Domenic Moras MSN, RN, FNP-BC CWON Wound, Ostomy, Continence Nurse Pager [phone]

## 2019-07-05 NOTE — Progress Notes (Signed)
Brief HX:  73 y.o. male with medical history significant of Alzheimer's dementia, DM 2, BPH, depression, chronic indwelling Foley, on home hospice per EMS was brought to the hospital for evaluation of ongoing nausea and vomiting. History is limited, attempted to call patient's POA did not answer therefore left a voicemail.  Patient is a poor historian given advanced dementia.  History per chart review and ER staff. Currently there is also concern for dark brown emesis as noted on his shirt. In the ER patient was noted to be septic with UTI, leukocytosis, tachycardia and slight tachypnea.  Started on IV Rocephin reviewing previous cultures.  CT of the abdomen showed mild prominence of bilateral collecting system due to urinary retention and acute pathology.  Labs also showed hyperglycemia with elevated anion gap, acute kidney injury with creatinine of 2.16 up from baseline of 1.0.  ER provider was able to speak with the daughter who confirmed the patient is a hospice and wishes to treat only reversible conditions such as infections. Medical team was requested to admit the patient. Foley catheter was changed in the ER.  New Foley has drained greater than 1.2 L of urine.    Subjective: Alert but not oriented to me place or person.  Sometimes incoherent.  Appears to be confused (may be his baseline) but alert.  Objective: Vital signs in last 24 hours: Temp:  [96.1 F (35.6 C)-98.8 F (37.1 C)] 96.1 F (35.6 C) (12/03 0051) Pulse Rate:  [61-66] 61 (12/03 0920) Resp:  [18] 18 (12/03 0920) BP: (107-108)/(65-68) 108/68 (12/03 0920) SpO2:  [99 %-100 %] 99 % (12/03 0920) Weight:  [67.3 kg] 67.3 kg (12/03 0500)  Intake/Output from previous day: 12/02 0701 - 12/03 0700 In: 2863.9 [I.V.:2363.6] Out: 2500 [Urine:2500] Intake/Output this shift: Total I/O In: 480 [P.O.:480] Out: 1750 [Urine:1750]  PEx:  Constitutional:  mucous membrane appears dry, remains nonverbal Neck: normal, supple, no masses, no  thyromegaly Respiratory: Basilar rhonchi Cardiovascular: Slight sinus tachycardia Abdomen: None tender nondistended, positive bowel sounds Musculoskeletal: Extremities are warm and well perfused Skin: Left-sided temporal/facial ulceration, bilateral lower extremity calluses.  Left lower extremity diabetic foot ulcer-noninfected Neurologic: No focal neuro deficits.  Moving all the extremities. Psychiatric: Poor judgment and insight  Results for orders placed or performed during the hospital encounter of 07/03/19 (from the past 24 hour(s))  Potassium     Status: Abnormal   Collection Time: 07/04/19  3:09 PM  Result Value Ref Range   Potassium 3.0 (L) 3.5 - 5.1 mmol/L  Lactic acid, plasma     Status: None   Collection Time: 07/04/19  3:09 PM  Result Value Ref Range   Lactic Acid, Venous 1.5 0.5 - 1.9 mmol/L  Glucose, capillary     Status: Abnormal   Collection Time: 07/04/19  5:18 PM  Result Value Ref Range   Glucose-Capillary 103 (H) 70 - 99 mg/dL  Glucose, capillary     Status: Abnormal   Collection Time: 07/04/19  8:59 PM  Result Value Ref Range   Glucose-Capillary 121 (H) 70 - 99 mg/dL  CBC     Status: Abnormal   Collection Time: 07/05/19  5:41 AM  Result Value Ref Range   WBC 10.0 4.0 - 10.5 K/uL   RBC 4.02 (L) 4.22 - 5.81 MIL/uL   Hemoglobin 11.7 (L) 13.0 - 17.0 g/dL   HCT 51.7 (L) 00.1 - 74.9 %   MCV 86.3 80.0 - 100.0 fL   MCH 29.1 26.0 - 34.0 pg   MCHC  33.7 30.0 - 36.0 g/dL   RDW 16.115.7 (H) 09.611.5 - 04.515.5 %   Platelets 315 150 - 400 K/uL   nRBC 0.0 0.0 - 0.2 %  Basic metabolic panel     Status: Abnormal   Collection Time: 07/05/19  5:41 AM  Result Value Ref Range   Sodium 140 135 - 145 mmol/L   Potassium 3.0 (L) 3.5 - 5.1 mmol/L   Chloride 107 98 - 111 mmol/L   CO2 26 22 - 32 mmol/L   Glucose, Bld 139 (H) 70 - 99 mg/dL   BUN 14 8 - 23 mg/dL   Creatinine, Ser 4.090.62 0.61 - 1.24 mg/dL   Calcium 8.3 (L) 8.9 - 10.3 mg/dL   GFR calc non Af Amer >60 >60 mL/min   GFR calc Af  Amer >60 >60 mL/min   Anion gap 7 5 - 15  Magnesium     Status: None   Collection Time: 07/05/19  5:41 AM  Result Value Ref Range   Magnesium 1.8 1.7 - 2.4 mg/dL  Glucose, capillary     Status: Abnormal   Collection Time: 07/05/19  8:33 AM  Result Value Ref Range   Glucose-Capillary 136 (H) 70 - 99 mg/dL  Glucose, capillary     Status: Abnormal   Collection Time: 07/05/19 12:03 PM  Result Value Ref Range   Glucose-Capillary 148 (H) 70 - 99 mg/dL    Studies/Results: Ct Abdomen Pelvis Wo Contrast  Result Date: 07/03/2019 CLINICAL DATA:  Nausea and vomiting and decreased urinary output EXAM: CT ABDOMEN AND PELVIS WITHOUT CONTRAST TECHNIQUE: Multidetector CT imaging of the abdomen and pelvis was performed following the standard protocol without IV contrast. COMPARISON:  01/12/18 FINDINGS: Lower chest: Stable dependent atelectatic changes are noted. Coronary calcifications are noted. Hepatobiliary: No focal liver abnormality is seen. No gallstones, gallbladder wall thickening, or biliary dilatation. Pancreas: Unremarkable. No pancreatic ductal dilatation or surrounding inflammatory changes. Spleen: Scattered calcified granulomas are noted. Adrenals/Urinary Tract: Adrenal glands again demonstrate stable adenoma similar to that seen on the prior exam. The kidneys show prominent extrarenal pelves bilaterally. The ureters are mildly prominent right greater than left. There is a calcification within the bladder just beyond/adjacent to the left UVJ. This may represent a recently passed stone or small bladder calculus. Air is noted within the bladder from the Foley catheter. The bladder is decompressed. Stomach/Bowel: Mild changes of rectal impaction are seen. Some rectal wall thickening is noted which may represent a degree of stercoral colitis. The more proximal colon shows scattered fecal material without obstructive change. The appendix is within normal limits. Small bowel appears within normal limits.  Vascular/Lymphatic: Aortic atherosclerosis. No enlarged abdominal or pelvic lymph nodes. Reproductive: Prostate is unremarkable. Other: Mild free fluid is noted along the pericolic gutters right greater than left. This is removed from the appendix and not related to appendiceal inflammation. Musculoskeletal: Postsurgical changes in the left hip are noted. Degenerative changes of lumbar spine are noted. Changes of prior L1 vertebral augmentation are noted. IMPRESSION: Mild prominence of the collecting systems bilaterally. There is a calcification near the left UVJ within the bladder which could be related to recently passed stone although may represent a bladder calculus. Foley catheter decompresses the bladder. Changes suggestive of stercoral colitis within the rectum. Mild fluid attenuation is noted in the pericolic gutters bilaterally without focal fluid collection. This is likely reactive in nature. Normal-appearing appendix. Electronically Signed   By: Alcide CleverMark  Lukens M.D.   On: 07/03/2019 10:44   Dg  Chest Portable 1 View  Result Date: 07/03/2019 CLINICAL DATA:  Weakness, tachycardia, nausea, vomiting, abdominal distension EXAM: PORTABLE CHEST 1 VIEW COMPARISON:  Portable exam 0833 hours compared to 01/05/2018 FINDINGS: Normal heart size, mediastinal contours, and pulmonary vascularity. Atherosclerotic calcification aorta. Skin folds project over chest bilaterally. No acute infiltrate, pleural effusion, or pneumothorax. Bones demineralized. IMPRESSION: No acute abnormalities. Aortic Atherosclerosis (ICD10-I70.0). Electronically Signed   By: Lavonia Dana M.D.   On: 07/03/2019 09:01   Dg Abd 2 Views  Result Date: 07/03/2019 CLINICAL DATA:  Nausea, vomiting. EXAM: ABDOMEN - 2 VIEW COMPARISON:  None. FINDINGS: The bowel gas pattern is normal. Mild amount of stool is noted in the colon and rectum. There is no evidence of free air. No radio-opaque calculi or other significant radiographic abnormality is seen.  IMPRESSION: No definite evidence of bowel obstruction or ileus. Electronically Signed   By: Marijo Conception M.D.   On: 07/03/2019 09:01    Scheduled Meds: . Chlorhexidine Gluconate Cloth  6 each Topical Daily  . heparin  5,000 Units Subcutaneous Q8H  . insulin aspart  0-15 Units Subcutaneous TID WC  . insulin aspart  0-5 Units Subcutaneous QHS  . insulin aspart  3 Units Subcutaneous TID WC  . insulin glargine  15 Units Subcutaneous Daily   Continuous Infusions: . 0.9 % NaCl with KCl 20 mEq / L 75 mL/hr at 07/05/19 0644  . cefTRIAXone (ROCEPHIN)  IV 1 g (07/05/19 0843)   PRN Meds:dextrose, hydrALAZINE  Assessment/Plan: Principal Problem:   DKA, type 2 ([place]) Active Problems:   Sepsis secondary to UTI ([place])   Alzheimer's dementia ([place])   Nausea & vomiting   Depression   BPH (benign prostatic hyperplasia)    Diabetic ketoacidosis, type II History of diabetes mellitus type 2 -DKA resolved - Was on DKA protocol-including insulin drip, fluids and electrolyte protocol -IV fluid for maintenance, will change to D5 once blood sugar falls below 250 -ABG stable  -Periodically check BMP and magnesium --> Replace  -Is now on sliding scale insulin and long-acting insulin as well as 3 times with meal -Antiemetics as necessary - Hemoglobin A1c 10.5 -Diabetes education  Sepsis secondary to urinary tract infection, CAUTI.  POA -On admission, Leukocytosis, lactic acidosis, tachycardia, tachypnea and clear source of infection.   -Improving - Cont Sepsis protocol.  Cultures ordered --> Pending  -  Follow-up urine culture --in progress   -Previous urine cultures grew Klebsiella sensitive to IV Rocephin.  Continue IV Rocephin for now. -Culture prelim report showing no growth so far -Await final culture -Supportive care, monitor urine output.  Electrolyte imbalance: Likely from DKA -Status post replacement -Also on receiving KCl with normal saline Recheck  Acute kidney injury on CKD  stage II -Improved to baseline - Baseline creatinine 1.0.  Admission creatinine 2.1 -Improved with hydration  Chronic urinary retention with Foley BPH -Follows outpatient urology.  Foley catheter changed in the ER.  Alzheimer's dementia -Supportive care.  On outpatient home hospice  History of depression -Continue home meds  Diabetic foot ulcer: Chronic -No acute issue at this time -Has pressure bandage on; no discharge or tenderness or complaints at this time -Consulted wound care  DVT prophylaxis: Subcu heparin Code Status: DNR Family Communication:  Family updated     LOS: 2 days   Antinette Keough Izetta Dakin

## 2019-07-06 DIAGNOSIS — E1110 Type 2 diabetes mellitus with ketoacidosis without coma: Secondary | ICD-10-CM

## 2019-07-06 LAB — BASIC METABOLIC PANEL
Anion gap: 8 (ref 5–15)
BUN: 15 mg/dL (ref 8–23)
CO2: 24 mmol/L (ref 22–32)
Calcium: 8 mg/dL — ABNORMAL LOW (ref 8.9–10.3)
Chloride: 108 mmol/L (ref 98–111)
Creatinine, Ser: 0.65 mg/dL (ref 0.61–1.24)
GFR calc Af Amer: >60 mL/min (ref >60)
GFR calc non Af Amer: >60 mL/min (ref >60)
Glucose, Bld: 112 mg/dL — ABNORMAL HIGH (ref 70–99)
Potassium: 3.4 mmol/L — ABNORMAL LOW (ref 3.5–5.1)
Sodium: 140 mmol/L (ref 135–145)

## 2019-07-06 LAB — MAGNESIUM: Magnesium: 2.1 mg/dL (ref 1.7–2.4)

## 2019-07-06 LAB — URINE CULTURE: Culture: <10000 — AB

## 2019-07-06 LAB — GLUCOSE, CAPILLARY
Glucose-Capillary: 76 mg/dL (ref 70–99)
Glucose-Capillary: 192 mg/dL — ABNORMAL HIGH (ref 70–99)

## 2019-07-06 MED ORDER — POTASSIUM CHLORIDE 20 MEQ/15ML (10%) PO SOLN
20.0000 meq | Freq: Once | ORAL | Status: AC
  Administered 2019-07-06: 20 meq via ORAL
  Filled 2019-07-06: qty 15

## 2019-07-06 MED ORDER — SULFAMETHOXAZOLE-TRIMETHOPRIM 800-160 MG PO TABS: 1.0000 | ORAL_TABLET | Freq: Two times a day (BID) | ORAL | 0 refills | Status: AC

## 2019-07-06 MED ORDER — PROBIOTIC 1-250 BILLION-MG PO CAPS: 1.0000 | ORAL_CAPSULE | Freq: Every day | ORAL | 0 refills | Status: AC

## 2019-07-06 NOTE — Progress Notes (Signed)
Patient discharged home with hospice. ALL discharge instructions given to daughter over phone. EMS called for transportation.

## 2019-07-06 NOTE — TOC Initial Note (Signed)
Transition of Care [facility]) - Initial/Assessment Note    Patient Details  Name: Arthur [name] MRN: [id number] Date of Birth: 1945-12-31  Transition of Care [facility]) CM/SW Contact:    Arthur Butcher, RN Phone Number: 07/06/2019, 11:04 AM  Clinical Narrative:                 Patient admitted for urinary tract infection, patient had been at home and having issues with nausea and vomiting and elevated blood sugar, family called EMS and patient was brought to the emergency department.  Patient lives at home alone but has 24 hour care.  Patient has a hired caregiver during the day and family caring for him the rest of the time.  Patient has dementia and is under [facility] care.  Patient will return home today under [facility].  Patient will transport home via [facility].   RNCM confirmed all information with patient's daughter, Arthur [name].     Expected Discharge Plan: Home w Hospice Care Barriers to Discharge: No Barriers Identified   Patient Goals and CMS Choice Patient states their goals for this hospitalization and ongoing recovery are:: To return home with hospice      Expected Discharge Plan and Services Expected Discharge Plan: Home w Hospice Care   Discharge Planning Services: CM Consult   Living arrangements for the past 2 months: Single Family Home Expected Discharge Date: 07/06/19                                    Prior Living Arrangements/Services Living arrangements for the past 2 months: Single Family Home Lives with:: Self Patient language and need for interpreter reviewed:: Yes Do you feel safe going back to the place where you live?: Yes      Need for Family Participation in Patient Care: Yes (Comment)(dementia- hospice care) Care giver support system in place?: Yes (comment) Current home services: DME, Homehealth aide Criminal Activity/Legal Involvement Pertinent to Current Situation/Hospitalization: No - Comment as needed  Activities of Daily  Living Home Assistive Devices/Equipment: None ADL Screening (condition at time of admission) Patient's cognitive ability adequate to safely complete daily activities?: No Is the patient deaf or have difficulty hearing?: No Does the patient have difficulty seeing, even when wearing glasses/contacts?: No Does the patient have difficulty concentrating, remembering, or making decisions?: Yes Patient able to express need for assistance with ADLs?: No Does the patient have difficulty dressing or bathing?: Yes Independently performs ADLs?: No Communication: Dependent, Independent Dressing (OT): Needs assistance Is this a change from baseline?: Pre-admission baseline Grooming: Needs assistance Is this a change from baseline?: Pre-admission baseline Feeding: Independent Is this a change from baseline?: Pre-admission baseline Bathing: Dependent Is this a change from baseline?: Pre-admission baseline Toileting: Dependent Is this a change from baseline?: Pre-admission baseline In/Out Bed: Dependent Is this a change from baseline?: Pre-admission baseline Walks in Home: Dependent Is this a change from baseline?: Pre-admission baseline Does the patient have difficulty walking or climbing stairs?: Yes Weakness of Legs: Both Weakness of Arms/Hands: None  Permission Sought/Granted Permission sought to share information with : Case Manager, Other (comment), Family Supports Permission granted to share information with : Yes, Verbal Permission Granted     Permission granted to share info w AGENCY: [facility]  Permission granted to share info w Relationship: Daughter     Emotional Assessment Appearance:: Appears stated age Attitude/Demeanor/Rapport: Unable to Assess  Alcohol / Substance Use: Not Applicable Psych Involvement: No (comment)  Admission diagnosis:  Abdominal distension [R14.0] Urinary tract infection without hematuria, site unspecified [N39.0] DKA, type 2 ([place])  [E11.10] Patient Active Problem List   Diagnosis Date Noted  . DKA, type 2 ([place]) 07/03/2019  . Sepsis secondary to UTI ([place]) 07/03/2019  . Alzheimer's dementia ([place]) 07/03/2019  . Nausea & vomiting 07/03/2019  . Depression 07/03/2019  . BPH (benign prostatic hyperplasia) 07/03/2019  . Hip fracture ([place]) 12/30/2018   PCP:  Arthur [name] [facility]:   [place], [place] - [address] [place] [address] Phone: [phone] Fax: [phone]     Social Determinants of Health (SDOH) Interventions    Readmission Risk Interventions No flowsheet data found.

## 2019-07-06 NOTE — Progress Notes (Signed)
Inpatient Diabetes Program Recommendations  AACE/ADA: New Consensus Statement on Inpatient Glycemic Control   Target Ranges:  Prepandial:   less than 140 mg/dL      Peak postprandial:   less than 180 mg/dL (1-2 hours)      Critically ill patients:  140 - 180 mg/dL   Results for BRYCETON, HANTZ (MRN [id number]) as of 07/06/2019 09:55  Ref. Range 07/05/2019 08:33 07/05/2019 12:03 07/05/2019 17:09 07/05/2019 21:39 07/06/2019 08:13  Glucose-Capillary Latest Ref Range: 70 - 99 mg/dL 136 (H) 148 (H) 161 (H) 179 (H) 76   Review of Glycemic Control  Current orders for Inpatient glycemic control: Lantus 15 units daily, Novolog 3 units TID with meals, Novolog 0-15 units TID with meals, Novolog 0-5 units QHS  Inpatient Diabetes Program Recommendations:   Insulin - Basal: Fasting glucose 76 mg/dl today. Please consider decreasing Lantus to 13 units QHS.  Thanks, Barnie Alderman, RN, MSN, CDE Diabetes Coordinator Inpatient Diabetes Program [phone] (Team Pager from 8am to 5pm)

## 2019-07-06 NOTE — Care Management Important Message (Signed)
Important Message  Patient Details  Name: Arthur [name] MRN: [id number] Date of Birth: 08/20/1945   Medicare Important Message Given:  Yes     [name] [name] 07/06/2019, 11:08 AM

## 2019-07-06 NOTE — Progress Notes (Signed)
OT Cancellation Note  Patient Details Name: Arthur [name] MRN: [id number] DOB: Dec 12, 1945   Cancelled Treatment:    Reason Eval/Treat Not Completed: OT screened, no needs identified, will sign off. Consult received, chart reviewed. Spoke with daughter by phone. Per daughter, the patient is bed bound at baseline, dependent care for all ADL, and is on hospice at home. Per the daughter, pt is not appropriate for therapy at this time. I am in agreement and will sign off for OT. Thank you for the consult.   Jeni Salles, MPH, MS, OTR/L ascom [phone] 07/06/19, 9:00 AM

## 2019-07-06 NOTE — Progress Notes (Signed)
PT Cancellation Note  Patient Details Name: CONNELLY NETTERVILLE MRN: [id number] DOB: 1946/01/24   Cancelled Treatment:    Reason Eval/Treat Not Completed: PT screened, no needs identified, will sign off. Consult received, chart reviewed. OT spoke with daughter by phone. Per daughter, the patient is bed bound at baseline, dependent care for all ADL/mobility, and is on hospice at home. Per the daughter, pt is not appropriate for therapy at this time. I am in agreement and will sign off for PT. Thank you for the consult.   Fleurette Woolbright H. Owens Shark, PT, DPT, NCS 07/06/19, 2:37 PM [phone]

## 2019-07-06 NOTE — Discharge Summary (Signed)
Physician Discharge Summary  Patient ID: Arthur [name] MRN: [id number] DOB/AGE: September 24, 1945 73 y.o.  Admit date: 07/03/2019 Discharge date: 07/06/2019  Admission Diagnoses: DKA, type II Sepsis  Discharge Diagnoses:  Principal Problem:   DKA, type 2 ([place]) Active Problems:   Sepsis secondary to UTI ([place])   Alzheimer's dementia ([place])   Nausea & vomiting   Depression   BPH (benign prostatic hyperplasia)   Discharged Condition: fair  Hospital Course:  73 y.o.malewith medical history significant ofAlzheimer's dementia, DM 2, BPH, depression, chronic indwelling Foley, on home hospice per EMS was brought to the hospital for evaluation of ongoing nausea and vomiting. History is limited, attempted to call patient's POA did not answer therefore left a voicemail. Patient is a poor historian given advanced dementia. History per chart review and ER staff. Currently there is also concern for dark brown emesis as noted on his shirt. In the ER patient was noted to be septic with UTI, leukocytosis, tachycardia and slight tachypnea. Started on IV Rocephin reviewing previous cultures. CT of the abdomen showed mild prominence of bilateral collecting system due to urinary retention and acute pathology. Labs also showed hyperglycemia with elevated anion gap, acute kidney injury with creatinine of 2.16 up from baseline of 1.0. ER provider was able to speak with the daughter who confirmed the patient is a hospice and wishes to treat only reversible conditions such as infections. Medical team was requested to admit the patient. Foley catheter was changed in the ER.New Foley has drained greater than 1.2 L of urine.    Diabetic ketoacidosis, type II History of diabetes mellitus type 2 -DKA resolved -Was on DKA protocol-including insulin drip, fluids and electrolyte protocol -Periodically check BMP and magnesium --> Replace  -Then changed to sliding scale insulin and long-acting insulin as well as 3  times with meal -Patient is to resume his home regimen as prescribed.  Also strongly advised to his daughter to make sure to check blood glucose at least 2-3 times daily and follow the antihyperglycemic agents direction. - Hemoglobin A1c 10.5 -Diabetes education -Strongly advised to follow-up with PCP in a week or 2 with the blood glucose log  Sepsis secondary to urinary tract infection, CAUTI. POA -Resolved  -Suspected to have on admission, Leukocytosis, lactic acidosis, tachycardia, tachypnea and clear source of infection.  -Blood culture so far grew nothing -Wound culture final report showing possible contamination -Previously urine cultures grew Klebsiella sensitive to IV Rocephin which was given in hospital. -Sent home with trimethoprim and sulfamethoxazole double strength twice daily for 5-7 more days -Vies to follow-up with PCP  Electrolyte imbalance: Resolved Likely from DKA -Status post replacement   Acute kidney injury on CKD stage II -Improved to baseline  Chronic urinary retention with Foley BPH -Status post Foley catheter replacement at the time of admission  -Patient was voiding and the Foley catheter was working  -Advised as patient follows outpatient urology .  Alzheimer's dementia -Supportive care. On outpatient home hospice  History of depression -Continue home meds  Diabetic foot ulcer: Chronic -No acute issue at this time -Has pressure bandage on; no discharge or tenderness or complaints at this time -Wound care consulted   Disposition: Patient was receiving home hospice care.  Spoke with daughter.  Advised to follow-up with patient's PCP.  And also home hospice care to be resumed.  Consults:   Significant Diagnostic Studies: Labs and radiology  Treatments: As per hospital course and discharge meds  Discharge Exam: Blood pressure 99/74, pulse 74, temperature  98 F (36.7 C), temperature source Oral, resp. rate 15, height 5\' 9"  (1.753 m),  weight 67.3 kg, SpO2 98 %.  Constitutional: mucous membrane appears dry, remains nonverbal Neck:normal, supple, no masses, no thyromegaly Respiratory:Basilar rhonchi Cardiovascular:Slight sinus tachycardia Abdomen:None tender nondistended, positive bowel sounds Musculoskeletal:Extremities are warm and well perfused Skin:Left-sided temporal/facial ulceration, bilateral lower extremity calluses. Left lower extremity diabetic foot ulcer-noninfected Neurologic:No focal neuro deficits. Moving all the extremities. Psychiatric: Alert but not oriented  Disposition: Discharge disposition: 01-Home or Self Care      Discharge Instructions    Call MD for:  temperature >100.4   Complete by: As directed    Diet - low sodium heart healthy   Complete by: As directed    Increase activity slowly   Complete by: As directed      Allergies as of 07/06/2019   No Known Allergies     Medication List    TAKE these medications   atorvastatin 10 MG tablet Commonly known as: LIPITOR Take 10 mg by mouth daily.   bisacodyl 10 MG suppository Commonly known as: DULCOLAX Place 1 suppository (10 mg total) rectally daily as needed for moderate constipation.   docusate sodium 100 MG capsule Commonly known as: COLACE Take 1 capsule (100 mg total) by mouth 2 (two) times daily.   glimepiride 2 MG tablet Commonly known as: AMARYL Take 2 mg by mouth daily with breakfast.   Levemir FlexTouch 100 UNIT/ML Pen Generic drug: Insulin Detemir Inject 15 Units into the skin Nightly.   metFORMIN 850 MG tablet Commonly known as: GLUCOPHAGE Take 850 mg by mouth 2 (two) times daily with a meal.   Probiotic 1-250 BILLION-MG Caps Take 1 capsule by mouth daily.   senna-docusate 8.6-50 MG tablet Commonly known as: Senokot-S Take 1 tablet by mouth at bedtime as needed for mild constipation.   sertraline 50 MG tablet Commonly known as: ZOLOFT Take 100 mg by mouth daily.    sulfamethoxazole-trimethoprim 800-160 MG tablet Commonly known as: BACTRIM DS Take 1 tablet by mouth 2 (two) times daily.   tamsulosin 0.4 MG Caps capsule Commonly known as: FLOMAX Take 1 capsule (0.4 mg total) by mouth daily after supper.   vitamin B-12 1000 MCG tablet Commonly known as: CYANOCOBALAMIN Take 1,000 mcg by mouth daily.        Signed: 11-08-1978 07/06/2019, 2:53 PM

## 2019-07-08 LAB — CULTURE, BLOOD (ROUTINE X 2)
Culture: NO GROWTH
Culture: NO GROWTH
Special Requests: ADEQUATE
Special Requests: ADEQUATE

## 2019-10-26 IMAGING — CR DG CHEST 2V
1 series · 2 of 2 positions shown · non-contrast
Comparison: Prior radiograph from 10/26/2012.

CLINICAL DATA: Initial evaluation for acute chest pain.

EXAM:
CHEST - 2 VIEW

[Series 1: dg chest 2 view · 0.14mm/px · 2 of 2 slices shown]
[im 1/2]
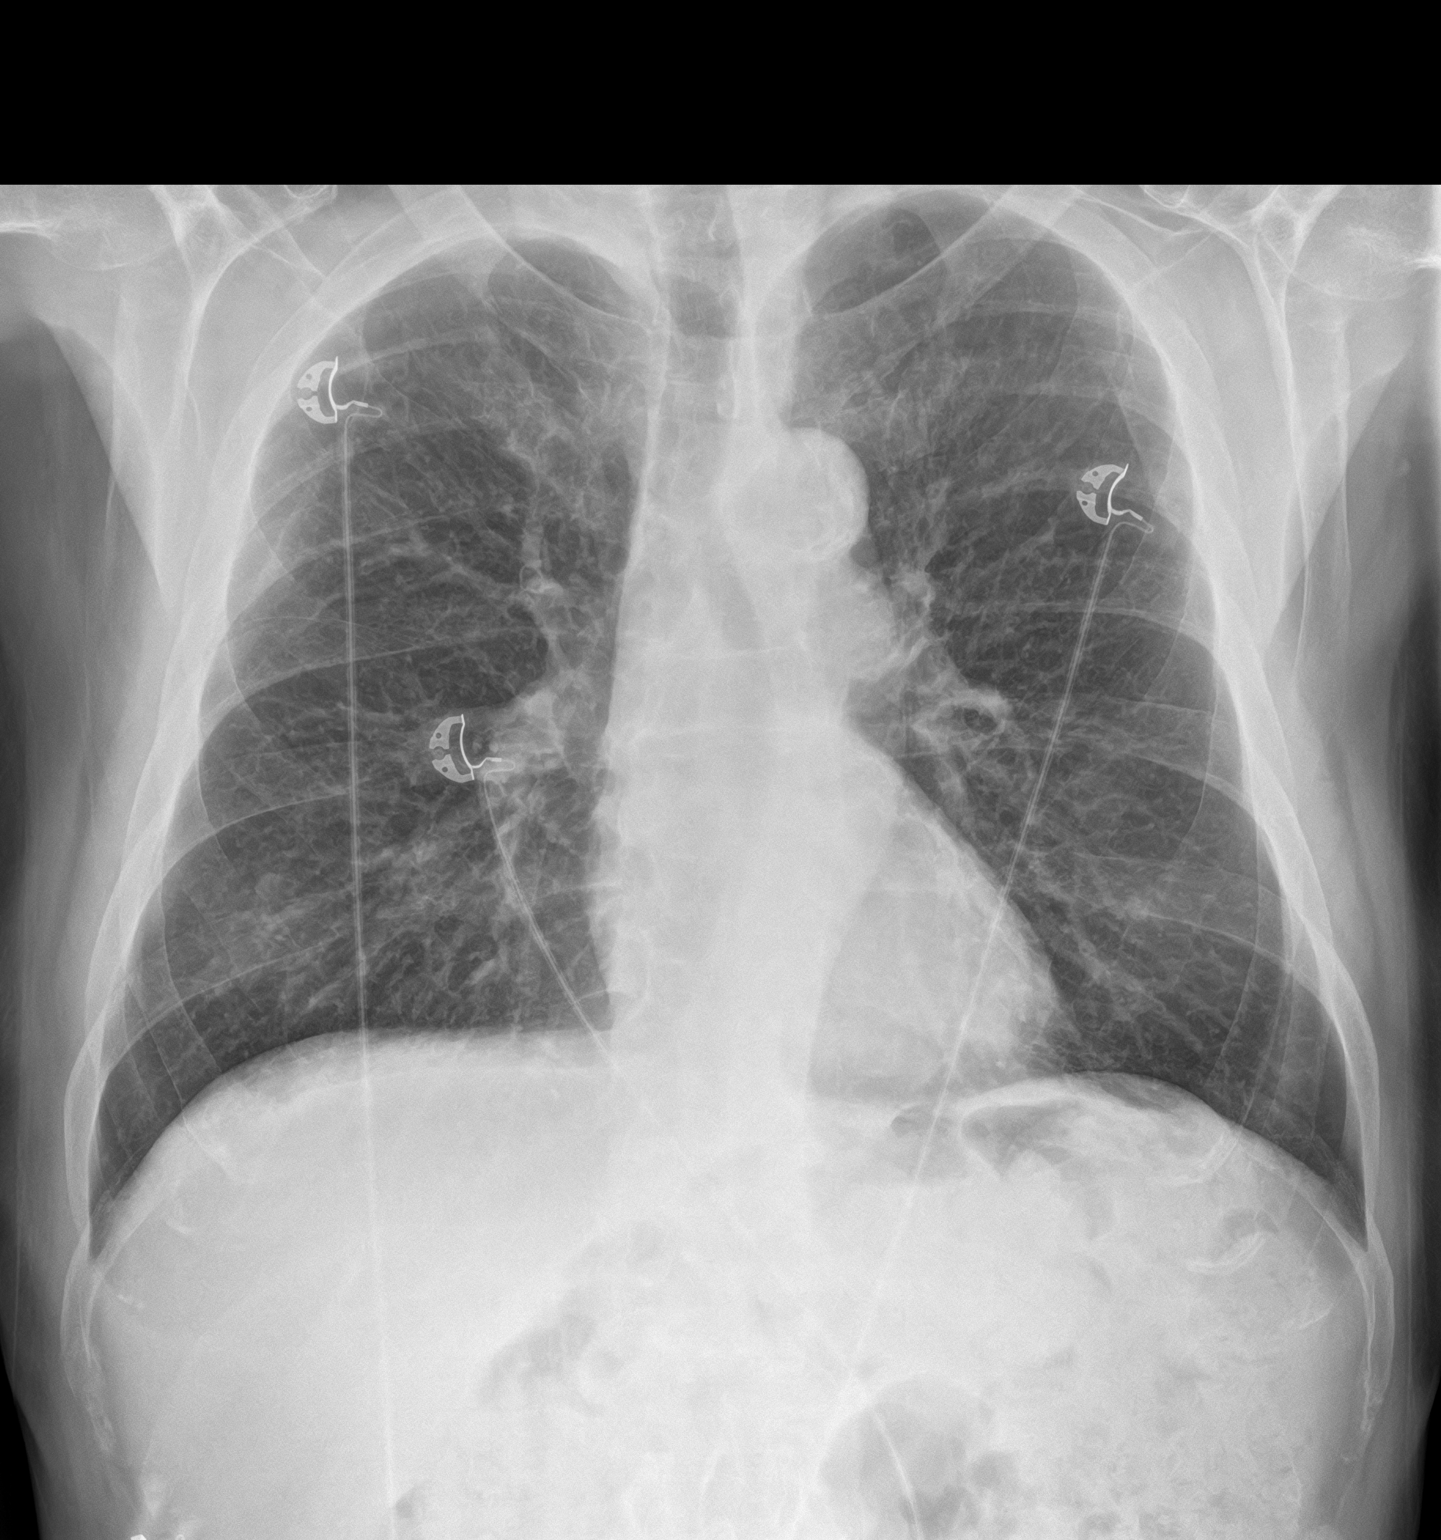
[im 2/2]
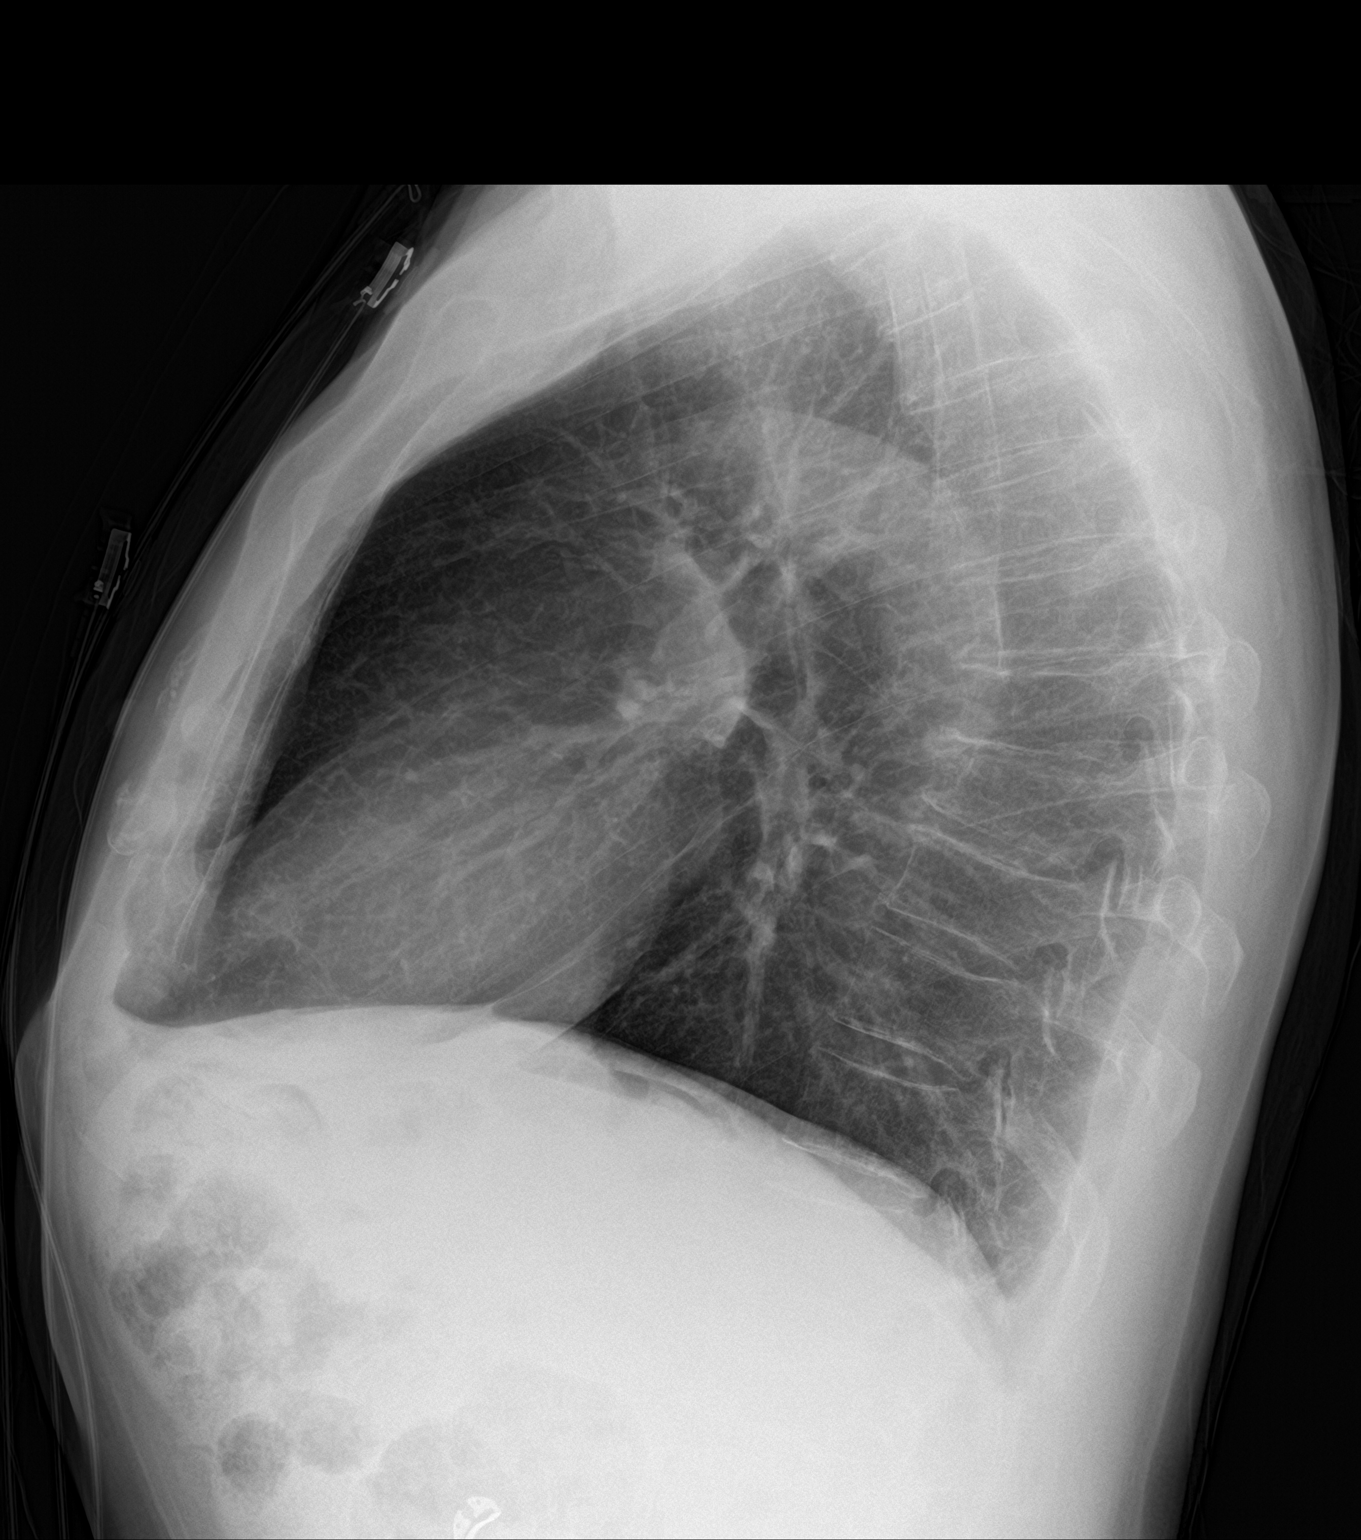

[2 of 2 positions shown; findings below may reference images not displayed]

FINDINGS: Cardiac and mediastinal silhouettes are stable in size and contour,
and remain within normal limits. Aortic atherosclerosis.

Lungs normally inflated. Mild chronic coarsening of the interstitial
markings. No focal infiltrates. No pulmonary edema or pleural
effusion. No pneumothorax.

No acute osseous abnormality. Degenerative changes noted about the
shoulders bilaterally. Mild degenerative spurring noted within the
midthoracic spine.
IMPRESSION: No active cardiopulmonary disease.

## 2019-11-01 DEATH — deceased

## 2020-03-18 ENCOUNTER — Other Ambulatory Visit: Payer: Self-pay | Admitting: Urology

## 2020-03-18 DIAGNOSIS — R339 Retention of urine, unspecified: Secondary | ICD-10-CM

## 2020-03-18 DIAGNOSIS — N139 Obstructive and reflux uropathy, unspecified: Secondary | ICD-10-CM

## 2020-10-19 IMAGING — CR LEFT FOOT - 2 VIEW
2 series · 2 of 2 positions shown · non-contrast
Comparison: None.

CLINICAL DATA: Left leg pain after fall

EXAM:
LEFT FOOT - 2 VIEW

[foot ap]
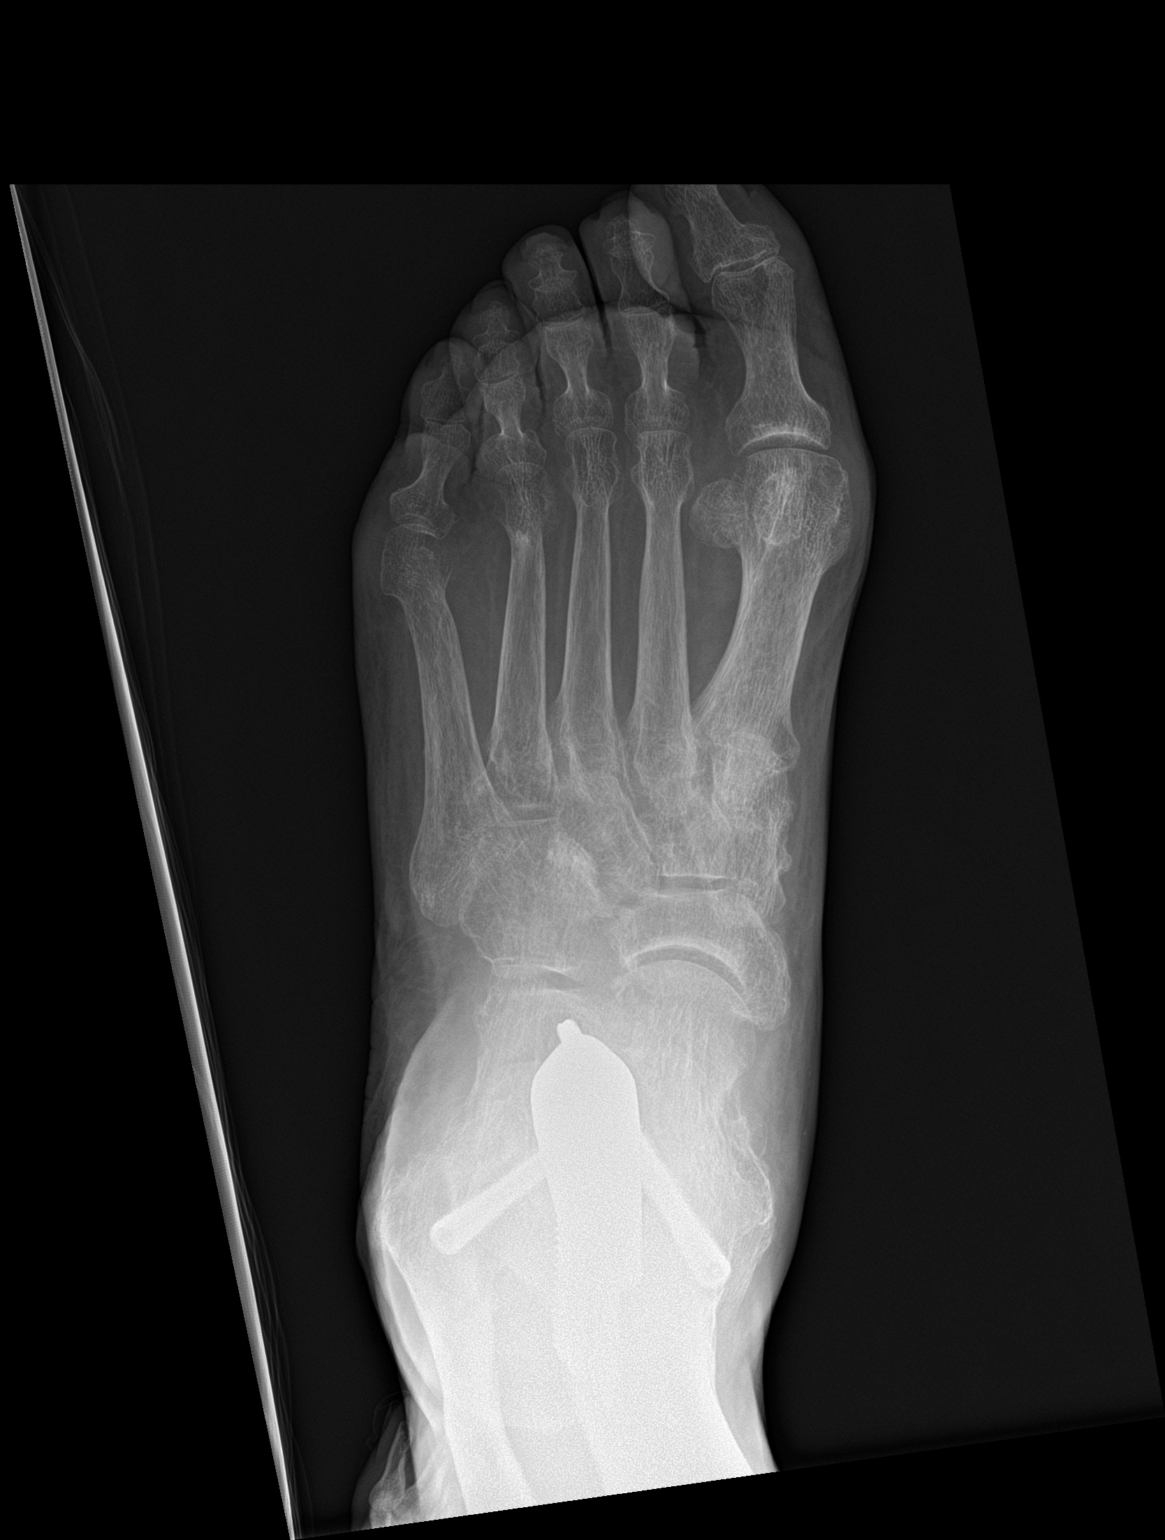

[foot lat]
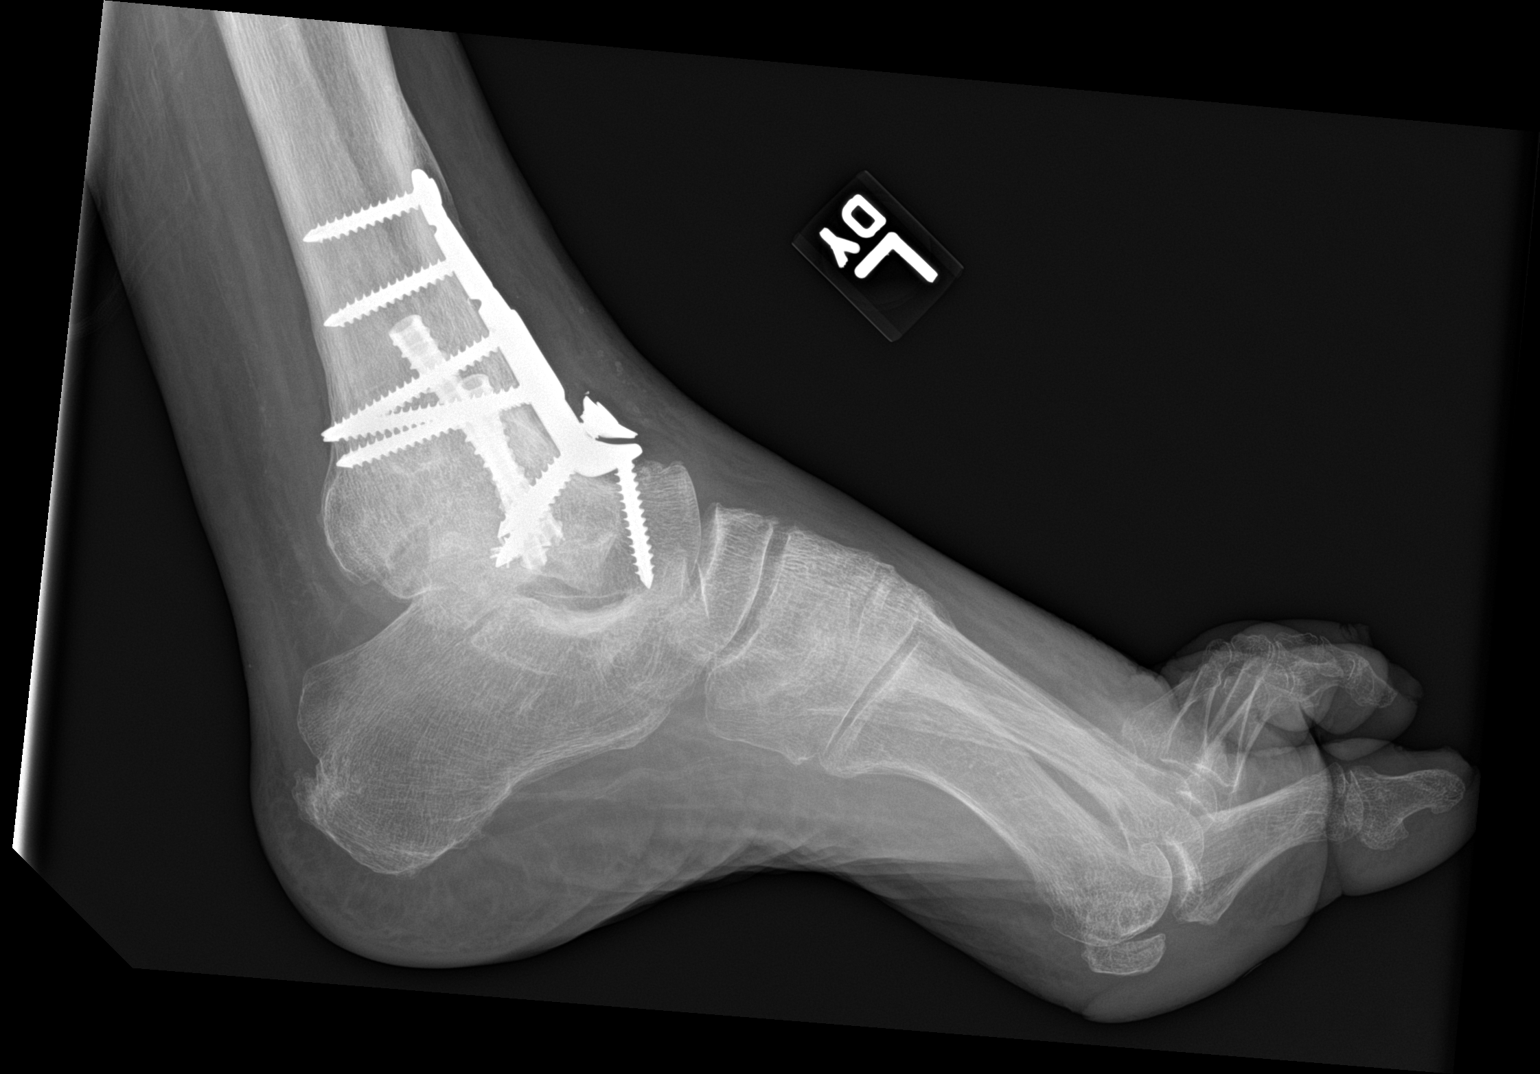

[2 of 2 positions shown; findings below may reference images not displayed]

FINDINGS: Status post tibiotalar fusion. No evidence of hardware loosening or
fracture.

No evidence of fracture or dislocation.

The joint spaces are preserved.

The visualized soft tissues are unremarkable.
IMPRESSION: Status post tibiotalar fusion.  No evidence of complication.

No fracture or dislocation is seen.

## 2020-10-20 IMAGING — CR OPERATIVE LEFT HIP WITH PELVIS
3 series · 3 of 3 positions shown · non-contrast
Comparison: Left hip radiographs dated 12/30/2018

CLINICAL DATA: Left IM nail

EXAM:
OPERATIVE left HIP (WITH PELVIS IF PERFORMED) 3 VIEWS
TECHNIQUE: Fluoroscopic spot image(s) were submitted for interpretation
post-operatively.
FLUOROSCOPY TIME:  42 seconds

[cont. (1 of 3)]
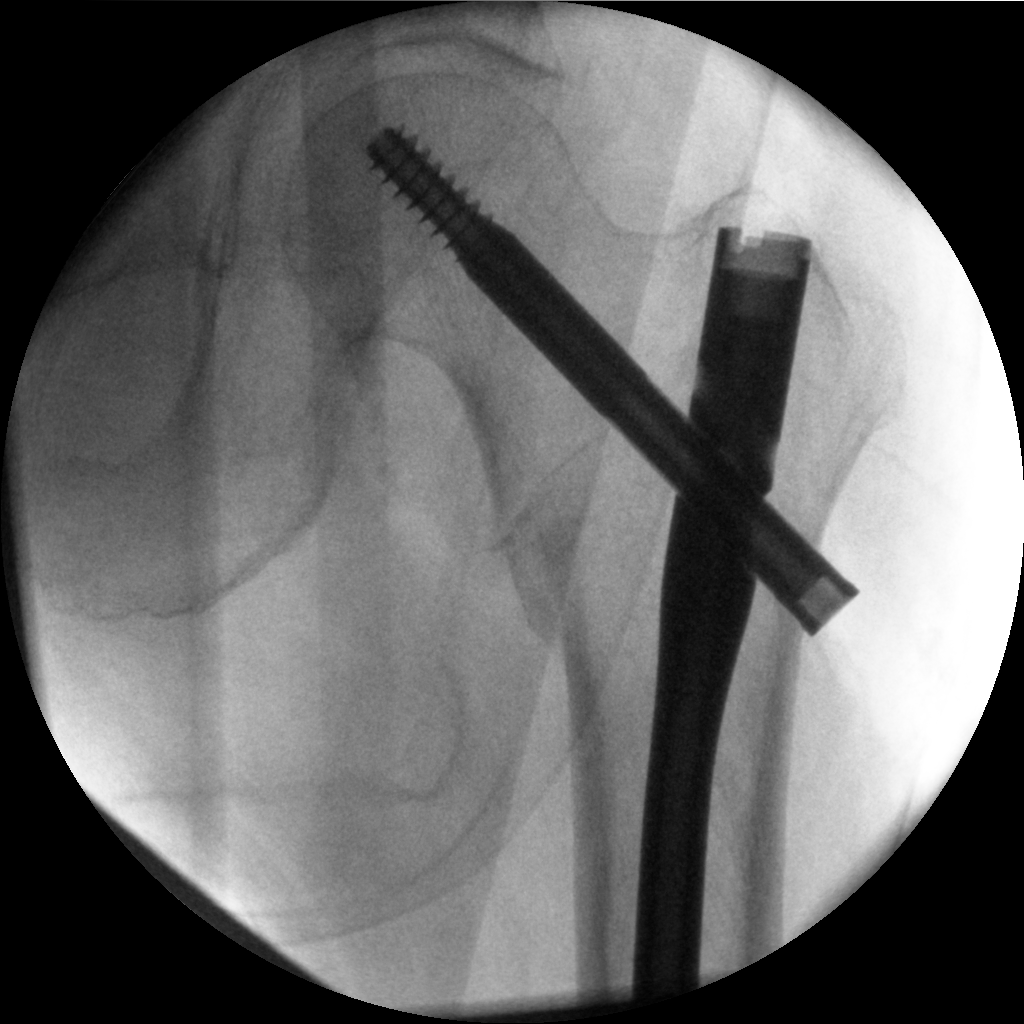

[cont. (2 of 3)]
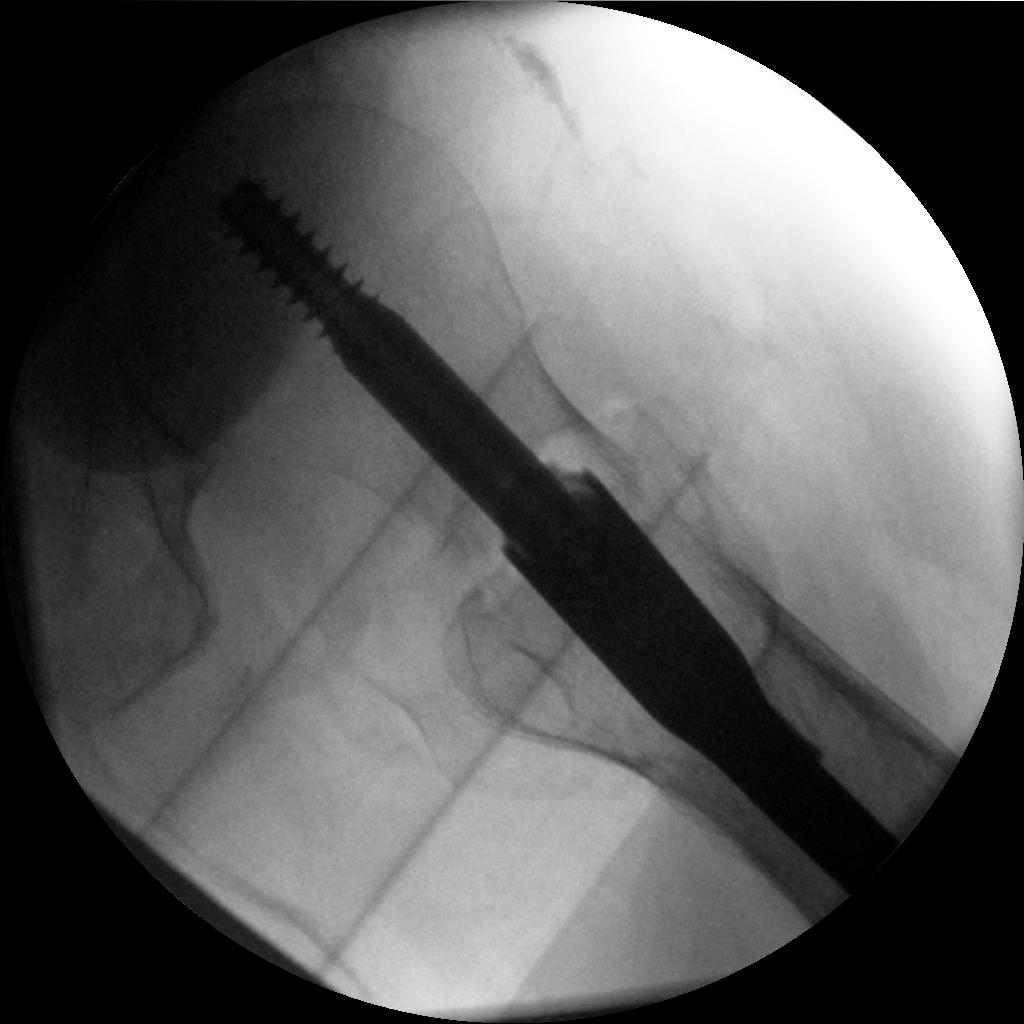

[cont. (3 of 3)]
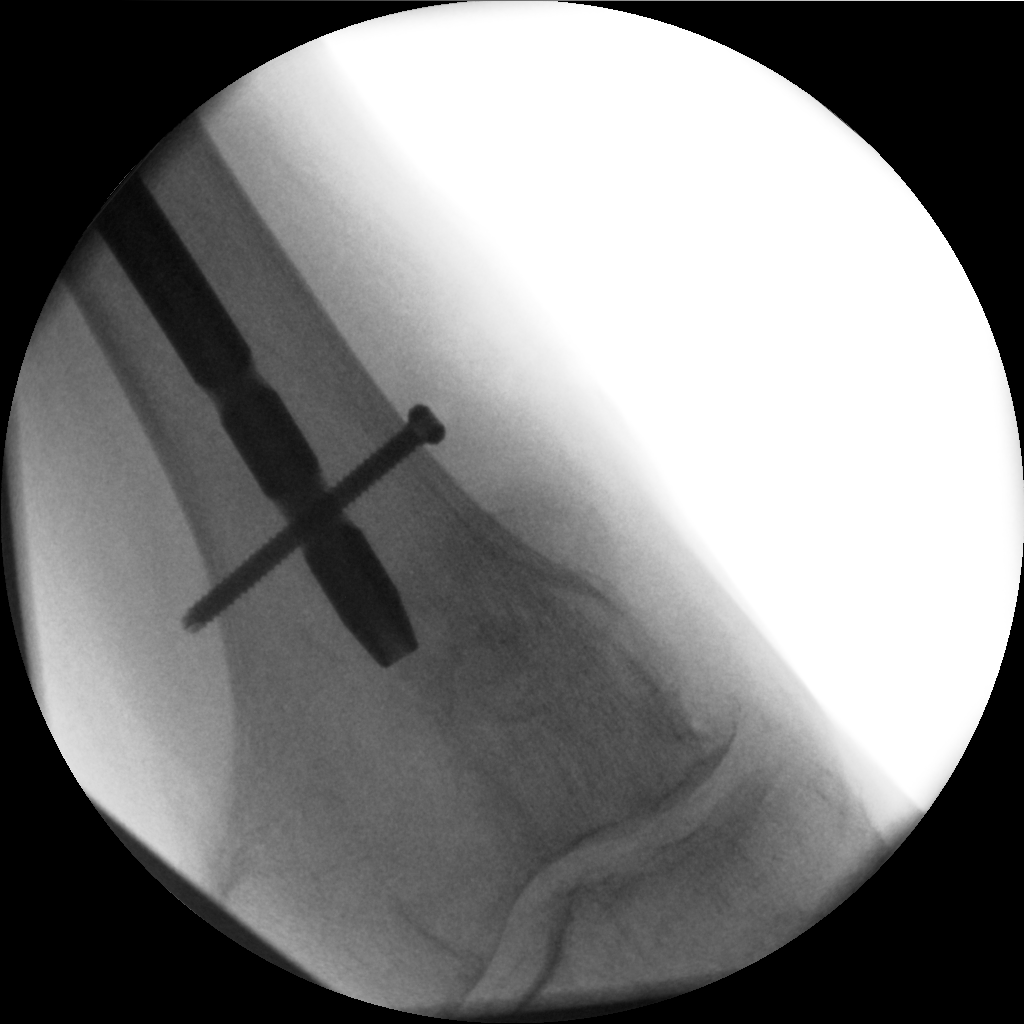

[3 of 3 positions shown; findings below may reference images not displayed]

FINDINGS: Intraoperative fluoroscopic images during IM nail with hip screw
fixation of an intertrochanteric left hip fracture. Fracture
fragments are in near anatomic alignment and position. Distal
interlocking screw.
IMPRESSION: IM nail with hip screw fixation of intertrochanteric left hip
fracture, as above.
